# Patient Record
Sex: Female | Born: 1935 | Race: White | Hispanic: No | Smoking: Never smoker
Health system: Southern US, Community
[De-identification: ages and names within clinical notes are randomized; demographics above are authoritative.]

## PROBLEM LIST (undated history)

## (undated) DIAGNOSIS — C50919 Malignant neoplasm of unspecified site of unspecified female breast: Secondary | ICD-10-CM

## (undated) DIAGNOSIS — Z923 Personal history of irradiation: Secondary | ICD-10-CM

## (undated) DIAGNOSIS — E119 Type 2 diabetes mellitus without complications: Secondary | ICD-10-CM

## (undated) DIAGNOSIS — K219 Gastro-esophageal reflux disease without esophagitis: Secondary | ICD-10-CM

## (undated) DIAGNOSIS — E079 Disorder of thyroid, unspecified: Secondary | ICD-10-CM

## (undated) HISTORY — DX: Gastro-esophageal reflux disease without esophagitis: K21.9

## (undated) NOTE — *Deleted (*Deleted)
[facility]  [address], [address] [place], [place] [address] Phone: [phone]  Fax: [phone]  Clinic Day:  04/15/2020  Referring physician: Marina Goodell, MD  Chief Complaint: Danielle Bennett is a 76 y.o. female with a history of multi-focal right breast DCIS (1995) and stage IA Her2/neu + left breast cancer (2015) who is seen for 6 month assessment.  HPI: The patient was last seen in the medical oncology clinic on 09/22/2019 via telemedicine. At that time, she is doing well.  She denies any complaints. Discussed BCI testing and the need for dental clearance for Prolia. She continued calcium and Vitamin D. She was still taking Arimidex but was considering discontinuing it.  The patient saw Dr. Cherylann Ratel on 12/08/2019. Her chronic kidney disease was felt to be stable with a recent GFR of 26. Her lower extremity edema was managed with furosemide 40 mg BID. She reported shortness of breath with minimal exertion. Follow up was planned for 3 months.  During the interim, ***   Past Medical History:  Diagnosis Date  . Breast cancer (HCC) 1995   right breast cancer  . Breast cancer (HCC) 2015   left breast cancer  . Diabetes mellitus without complication (HCC)   . GERD (gastroesophageal reflux disease)   . Personal history of radiation therapy 1995  . Personal history of radiation therapy 2015  . Thyroid disease     Past Surgical History:  Procedure Laterality Date  . BREAST BIOPSY Left 10/01/2017   affirm stereo/ neg  . BREAST LUMPECTOMY Right 1995  . BREAST LUMPECTOMY Left 04/2014    Family History  Problem Relation Age of Onset  . Heart attack Mother   . Kidney cancer Brother   . Prostate cancer Brother   . Esophageal cancer Brother   . Breast cancer Neg Hx     Social History:  reports that she has never smoked. She has never used smokeless tobacco. She reports that she does not drink alcohol and does not use drugs.  She lives 10  miles [employer] of [address]. 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. She lives alone.  She has family members who live on either side of her. The patient is alone*** today.  Allergies:  Allergies  Allergen Reactions  . Simvastatin     Other reaction(s): Other (See Comments) Myalgias    Current Medications: Current Outpatient Medications  Medication Sig Dispense Refill  . anastrozole (ARIMIDEX) 1 MG tablet Take 1 tablet (1 mg total) by mouth daily. 90 tablet 0  . apixaban (ELIQUIS) 5 MG TABS tablet Take 2.5 mg by mouth daily.     Marland Kitchen atorvastatin (LIPITOR) 40 MG tablet TAKE ONE TABLET BY MOUTH ONCE DAILY AT NIGHT TIME - MWF. and also takes a half tablet all other days.    . betamethasone valerate (VALISONE) 0.1 % cream Apply to skin as directed prn for itch    . Calcium Carb-Cholecalciferol (CALTRATE 600+D) 600-800 MG-UNIT TABS Take 1 tablet by mouth 2 (two) times daily.     . famotidine (PEPCID) 10 MG tablet Take 10 mg by mouth at bedtime.     . furosemide (LASIX) 20 MG tablet Take 40 mg by mouth 2 (two) times daily.     Marland Kitchen glipiZIDE (GLUCOTROL XL) 10 MG 24 hr tablet Take 10 mg by mouth daily.     Marland Kitchen glucose blood test strip USE 3 (THREE) TIMES DAILY.    Marland Kitchen insulin lispro (HUMALOG) 100 UNIT/ML KwikPen 10  before breakfast, 12 units before lunch and dinner, plus sliding scale, max 60 units daily    . Insulin Pen Needle (B-D ULTRAFINE III SHORT PEN) 31G X 8 MM MISC USE 1  THREE TIMES DAILY AS DIRECTED    . levothyroxine (SYNTHROID, LEVOTHROID) 137 MCG tablet Take 137 mcg by mouth daily.     . Liraglutide (VICTOZA) 18 MG/3ML SOPN Inject into the skin daily.     Marland Kitchen lisinopril (PRINIVIL,ZESTRIL) 20 MG tablet Take 10 mg by mouth daily.   3  . metoprolol succinate (TOPROL-XL) 100 MG 24 hr tablet Take 100 mg by mouth. Take 1.5 tablets once a day.    . metroNIDAZOLE (METROCREAM) 0.75 % cream Apply 1 application topically daily.     . Microlet Lancets MISC USE TO CHECK GLUCOSE THREE TIMES DAILY AS  DIRECTED    . nystatin (MYCOSTATIN/NYSTOP) powder Apply topically 3 (three) times daily. 30 g 5  . PREVIDENT 5000 DRY MOUTH 1.1 % GEL dental gel BRUSH WITH PASTE 2 TO 3 TIMES PER DAY ** DO NOT RINSE **  5  . TRESIBA FLEXTOUCH 100 UNIT/ML SOPN FlexTouch Pen Inject 35 Units into the skin daily.      No current facility-administered medications for this visit.    Review of Systems  Constitutional: Negative for chills, diaphoresis, fever, malaise/fatigue and weight loss.       Feels "good".  HENT: Negative.  Negative for congestion, ear pain, nosebleeds, sinus pain and sore throat.   Eyes: Negative.  Negative for blurred vision and double vision.  Respiratory: Positive for shortness of breath (with walking). Negative for cough and sputum production.   Cardiovascular: Positive for orthopnea (sleep in a chair at times). Negative for chest pain, palpitations and leg swelling.  Gastrointestinal: Negative for abdominal pain, blood in stool, constipation, diarrhea, heartburn, melena, nausea and vomiting.  Genitourinary: Negative.  Negative for dysuria, frequency, hematuria and urgency.  Musculoskeletal: Negative.  Negative for back pain, joint pain and myalgias.  Skin: Positive for rash (under breasts). Negative for itching.  Neurological: Negative.  Negative for dizziness, tingling, sensory change, speech change, weakness and headaches.       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  Endo/Heme/Allergies: Does not bruise/bleed easily.       Diabetes. Thyroid disease on Synthroid.   Psychiatric/Behavioral: Negative.  Negative for depression and memory loss. The patient is not nervous/anxious and does not have insomnia.   All other systems reviewed and are negative.  Performance status (ECOG):  1***  Wt Readings from Last 3 Encounters:  09/15/19 276 lb (125.2 kg)  12/22/18 274 lb 4.8 oz (124.4 kg)  08/10/18 250 lb (113.4 kg)    Vital Signs There were no vitals taken for this visit.   Physical Exam  No visits  with results within 3 Day(s) from this visit.  Latest known visit with results is:  Appointment on 09/15/2019  Component Date Value Ref Range Status  . CA 27.29 09/15/2019 16.5  0.0 - 38.6 U/mL Final   Comment: (NOTE) Siemens Centaur Immunochemiluminometric Methodology [facility]) Values obtained with different assay methods or kits cannot be used interchangeably. Results cannot be interpreted as absolute evidence of the presence or absence of malignant disease. Performed At: [facility] [address] [place], [place] [id number] Jolene Schimke MD WU:[id number]   . Sodium 09/15/2019 137  135 - 145 mmol/L Final  . Potassium 09/15/2019 5.0  3.5 - 5.1 mmol/L Final  . Chloride 09/15/2019 104  98 - 111 mmol/L Final  .  CO2 09/15/2019 21* 22 - 32 mmol/L Final  . Glucose, Bld 09/15/2019 224* 70 - 99 mg/dL Final   Glucose reference range applies only to samples taken after fasting for at least 8 hours.  . BUN 09/15/2019 41* 8 - 23 mg/dL Final  . Creatinine, Ser 09/15/2019 1.86* 0.44 - 1.00 mg/dL Final  . Calcium 09/15/2019 9.0  8.9 - 10.3 mg/dL Final  . Total Protein 09/15/2019 6.8  6.5 - 8.1 g/dL Final  . Albumin 09/15/2019 3.3* 3.5 - 5.0 g/dL Final  . AST 09/15/2019 20  15 - 41 U/L Final  . ALT 09/15/2019 19  0 - 44 U/L Final  . Alkaline Phosphatase 09/15/2019 61  38 - 126 U/L Final  . Total Bilirubin 09/15/2019 0.8  0.3 - 1.2 mg/dL Final  . GFR calc non Af Amer 09/15/2019 25* >60 mL/min Final  . GFR calc Af Amer 09/15/2019 29* >60 mL/min Final  . Anion gap 09/15/2019 12  5 - 15 Final   Performed at [facility], [address], [place], [place] [address]    Assessment:  Haislee Corso is a 30 y.o. female with multi-focal right breast DCIS (1995) and stage IA Her2/neu + left breast cancer (2015).  She was diagnosed with multifocal DCIS of the right breast in 1995.  She underwent biopsy on 02/01/1994.  Pathology revealed multi-focal DCIS with comedo necrosis.   There was no evidence of lymphatic/vascular invasion.  Microcalcifications were present.  Margins were close (less than 1 mm).  She underwent re-excision on 03/01/1994 by Dr. Siri Cole at [facility].  There was no gross evidence of malignancy.  She received radiation.  She did not receive hormonal therapy.  She was diagnosed with Her2/neu + left breast cancer in 2015.  Stereotactic core biopsy on 04/05/2014 revealed invasive mammary carcinoma of no special type (1.1 mm) in a background of DCIS.  Wide excision of the mass in the left upper outer quadrant on 04/26/2014 revealed grade II DCIS, cribriform type with focal lobular cytology.  There was no residual invasive carcinoma.  No tumor was seen in 2 lymph nodes.  Tumor was ER positive (> 90%), PR positive (90%), and Her2/neu 3+.  Pathologic stage was T1aN0(sn) M0.  She received adjuvant left whole breast radiation in 06/2014.  She started Arimidex in 07/2014.  Bilateral mammogram on 08/22/2016 revealed no evidence of malignancy.  Bilateral diagnostic mammogram on 09/18/2017 revealed a 1.2 cm group of indeterminate calcifications in the left breast adjacent to the lumpectomy site.  Left breast biopsy on 10/01/2017 revealed cytologic changes c/w prior radiation therapy.  There was scar-like fibrosis and foreign body giant cell reaction.  Microcalcifications were present.  There was no atypia or malignancy.  Bilateral diagnostic mammogram on 04/19/2019 revealed no evidence of malignancy.  CA27.29 has been followed: 19.2 on 02/07/2016, 16.4 on 06/05/2016, 18.1 on 10/09/2016, 16.2 on 04/09/2017, 17.4 on 10/15/2017, 15.9 on 04/22/2018, 20.4 on 12/22/2018, and 16.5 on 09/15/2019.  Bone density on 06/27/2015 revealed osteoporosis with a T score of -3.2 in the left femoral neck, -2.5 in the hip and -2.4 in the left forearm.  She was previously on Fosamax, but discontinued for an unspecified period of time.  Bone density on 06/26/2017 revealed osteoporosis with  a T-score of -3.7 in the left femoral neck.  Bone density on 09/20/2019 revealed osteoporosis with a T-score of -3.5 at the left femoral neck.  She restarted Fosamax in 06/2015 (discontinued 2018).  She is on calcium and  vitamin D.  She was admitted to [facility] from 11/12/2017 - 11/28/2017.  She developed acute hypoxic hypercarbic respiratory failure requiring intubation secondary to CAP. She was admitted to [facility] MICU and transferred to [place] [facility] on 11/14/17 following extubation for continued treatment of her CAP vs. aspiration pneumonia. Following resolution of PNA, she was found to have temporarily depressed LVEF (15-20%).  She required significant diuresis.   Repeat echo 11/26/2017 showed LVEF >70%, suggesting temporarily depressed EF due to tachyarrhythmia-induced cardiomyopathy secondary to hypervolemia and uncontrolled afib in setting of acute respiratory illness.  Symptomatically, ***  Plan: 1.   Labs today: CBC with diff, CMP, CA27.29   2.   Stage IA Her2/neu + left breast cancer             Clinically, she is doing well.             Bilateral diagnostic mammogram on 04/19/2019 revealed no evidence of malignancy.   She has been on Arimidex since 07/2014.  Review high risk Her2/neu + breast cancer and consideration of extended adjuvant endocrine therapy.  Patient was considering discontinuing Arimidex.  Discuss extended adjuvant therapy.  Discuss BCI testing.  Suspect patient will have a high risk of recurrence. 3.   Osteoporosis             Bone density on 09/20/2019 revealed osteoporosis with a T-score of -3.5 at the left femoral neck.    She restarted Fosamax in 06/2015 (discontinued 2018 secondary to diarrhea).    She is on calcium and vitamin D.  Discuss consideration of Fosamax or Prolia.  Discuss need for dental clearance. 4.   Candidal rash  Patient has moist areas under breasts and history of recurrent Candidal infections.  Continue Nystatin prn. 5.   Screening mammogram  04/19/2020. 6.   Please send copy of bone density to patient. 7.   RN to send patient booklet with phone number to contact [facility] regarding cost. 8.   RN to call patient about whether she wishes to pursue BCI testing. 9.   RN to call patient regarding decision re: Prolia and dental clearance. 10.   RTC in 6 months for MD assess, labs (CBC with diff, CMP, CA27.29), and review of testing.  I discussed the assessment and treatment plan with the patient.  The patient was provided an opportunity to ask questions and all were answered.  The patient agreed with the plan and demonstrated an understanding of the instructions.  The patient was advised to call back if the symptoms worsen or if the condition fails to improve as anticipated.  I provided *** minutes of face-to-face time during this this encounter and > 50% was spent counseling as documented under my assessment and plan.  Rosey Bath, MD, PhD    04/15/2020, 1:09 PM  I, Theador Hawthorne, am acting as scribe for [employer]. Merlene Pulling, MD, PhD.  {Add scribe attestation statement}

---
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
## 1993-06-24 DIAGNOSIS — Z923 Personal history of irradiation: Secondary | ICD-10-CM

## 1993-06-24 DIAGNOSIS — C50919 Malignant neoplasm of unspecified site of unspecified female breast: Secondary | ICD-10-CM

## 1993-06-24 HISTORY — DX: Personal history of irradiation: Z92.3

## 1993-06-24 HISTORY — PX: BREAST LUMPECTOMY: SHX2

## 1993-06-24 HISTORY — DX: Malignant neoplasm of unspecified site of unspecified female breast: C50.919

## 1994-04-05 DIAGNOSIS — D0510 Intraductal carcinoma in situ of unspecified breast: Secondary | ICD-10-CM | POA: Insufficient documentation

## 2005-11-12 ENCOUNTER — Ambulatory Visit: Payer: Self-pay | Admitting: Gastroenterology

## 2006-07-22 ENCOUNTER — Ambulatory Visit: Payer: Self-pay | Admitting: Family Medicine

## 2006-07-25 ENCOUNTER — Ambulatory Visit: Payer: Self-pay | Admitting: Family Medicine

## 2010-11-27 ENCOUNTER — Ambulatory Visit: Payer: Self-pay | Admitting: Family Medicine

## 2011-01-08 ENCOUNTER — Ambulatory Visit: Payer: Self-pay | Admitting: Internal Medicine

## 2011-01-23 ENCOUNTER — Ambulatory Visit: Payer: Self-pay | Admitting: Internal Medicine

## 2011-03-19 ENCOUNTER — Emergency Department: Payer: Self-pay | Admitting: Emergency Medicine

## 2012-01-14 ENCOUNTER — Ambulatory Visit: Payer: Self-pay | Admitting: Family Medicine

## 2012-04-30 ENCOUNTER — Ambulatory Visit: Payer: Self-pay | Admitting: Gastroenterology

## 2013-02-09 ENCOUNTER — Ambulatory Visit: Payer: Self-pay | Admitting: Family Medicine

## 2013-02-11 ENCOUNTER — Ambulatory Visit: Payer: Self-pay | Admitting: Family Medicine

## 2013-08-16 ENCOUNTER — Ambulatory Visit: Payer: Self-pay | Admitting: Family Medicine

## 2013-11-26 ENCOUNTER — Ambulatory Visit: Payer: Self-pay | Admitting: Physician Assistant

## 2013-12-10 ENCOUNTER — Ambulatory Visit: Payer: Self-pay | Admitting: Physician Assistant

## 2014-02-04 ENCOUNTER — Ambulatory Visit: Payer: Self-pay | Admitting: Family Medicine

## 2014-02-04 LAB — CBC WITH DIFFERENTIAL/PLATELET
Basophil #: 0.1 10*3/uL (ref 0.0–0.1)
Basophil %: 0.8 %
Eosinophil #: 0 10*3/uL (ref 0.0–0.7)
Eosinophil %: 0.3 %
HCT: 41.6 % (ref 35.0–47.0)
HGB: 13.7 g/dL (ref 12.0–16.0)
LYMPHS ABS: 1.6 10*3/uL (ref 1.0–3.6)
Lymphocyte %: 15.3 %
MCH: 30.2 pg (ref 26.0–34.0)
MCHC: 33 g/dL (ref 32.0–36.0)
MCV: 91 fL (ref 80–100)
MONO ABS: 0.6 x10 3/mm (ref 0.2–0.9)
MONOS PCT: 5.8 %
NEUTROS PCT: 77.8 %
Neutrophil #: 8.1 10*3/uL — ABNORMAL HIGH (ref 1.4–6.5)
PLATELETS: 310 10*3/uL (ref 150–440)
RBC: 4.56 10*6/uL (ref 3.80–5.20)
RDW: 14.7 % — ABNORMAL HIGH (ref 11.5–14.5)
WBC: 10.4 10*3/uL (ref 3.6–11.0)

## 2014-02-04 LAB — URINALYSIS, COMPLETE
BLOOD: NEGATIVE
Bilirubin,UR: NEGATIVE
GLUCOSE, UR: NEGATIVE
Ketone: NEGATIVE
NITRITE: NEGATIVE
Ph: 6 (ref 5.0–8.0)
Protein: NEGATIVE
Specific Gravity: 1.015 (ref 1.000–1.030)

## 2014-02-04 LAB — BASIC METABOLIC PANEL
Anion Gap: 11 (ref 7–16)
BUN: 16 mg/dL (ref 7–18)
CALCIUM: 9.7 mg/dL (ref 8.5–10.1)
CHLORIDE: 98 mmol/L (ref 98–107)
Co2: 24 mmol/L (ref 21–32)
Creatinine: 0.95 mg/dL (ref 0.60–1.30)
EGFR (Non-African Amer.): 58 — ABNORMAL LOW
Glucose: 206 mg/dL — ABNORMAL HIGH (ref 65–99)
Osmolality: 274 (ref 275–301)
Potassium: 4.3 mmol/L (ref 3.5–5.1)
Sodium: 133 mmol/L — ABNORMAL LOW (ref 136–145)

## 2014-02-06 LAB — URINE CULTURE

## 2014-03-18 ENCOUNTER — Ambulatory Visit: Payer: Self-pay | Admitting: Family Medicine

## 2014-03-24 ENCOUNTER — Ambulatory Visit: Payer: Self-pay | Admitting: Family Medicine

## 2014-04-05 ENCOUNTER — Ambulatory Visit: Payer: Self-pay | Admitting: Family Medicine

## 2014-04-21 ENCOUNTER — Ambulatory Visit: Payer: Self-pay | Admitting: Surgery

## 2014-04-21 DIAGNOSIS — I10 Essential (primary) hypertension: Secondary | ICD-10-CM

## 2014-04-21 LAB — BASIC METABOLIC PANEL
ANION GAP: 12 (ref 7–16)
BUN: 12 mg/dL (ref 7–18)
CREATININE: 0.86 mg/dL (ref 0.60–1.30)
Calcium, Total: 9.2 mg/dL (ref 8.5–10.1)
Chloride: 104 mmol/L (ref 98–107)
Co2: 26 mmol/L (ref 21–32)
EGFR (African American): >60
Glucose: 137 mg/dL — ABNORMAL HIGH (ref 65–99)
OSMOLALITY: 285 (ref 275–301)
POTASSIUM: 4.1 mmol/L (ref 3.5–5.1)
Sodium: 142 mmol/L (ref 136–145)

## 2014-04-21 LAB — CBC WITH DIFFERENTIAL/PLATELET
BASOS PCT: 0.7 %
Basophil #: 0.1 10*3/uL (ref 0.0–0.1)
EOS ABS: 0.1 10*3/uL (ref 0.0–0.7)
Eosinophil %: 1.1 %
HCT: 40.9 % (ref 35.0–47.0)
HGB: 13.6 g/dL (ref 12.0–16.0)
LYMPHS PCT: 15.5 %
Lymphocyte #: 1.5 10*3/uL (ref 1.0–3.6)
MCH: 30.7 pg (ref 26.0–34.0)
MCHC: 33.1 g/dL (ref 32.0–36.0)
MCV: 93 fL (ref 80–100)
Monocyte #: 0.5 x10 3/mm (ref 0.2–0.9)
Monocyte %: 5.2 %
Neutrophil #: 7.5 10*3/uL — ABNORMAL HIGH (ref 1.4–6.5)
Neutrophil %: 77.5 %
Platelet: 277 10*3/uL (ref 150–440)
RBC: 4.42 10*6/uL (ref 3.80–5.20)
RDW: 14.7 % — AB (ref 11.5–14.5)
WBC: 9.7 10*3/uL (ref 3.6–11.0)

## 2014-04-24 HISTORY — PX: BREAST LUMPECTOMY: SHX2

## 2014-04-26 ENCOUNTER — Ambulatory Visit: Payer: Self-pay | Admitting: Surgery

## 2014-04-26 DIAGNOSIS — C50412 Malignant neoplasm of upper-outer quadrant of left female breast: Secondary | ICD-10-CM | POA: Insufficient documentation

## 2014-05-13 LAB — PATHOLOGY REPORT

## 2014-05-27 ENCOUNTER — Ambulatory Visit: Payer: Self-pay | Admitting: Radiation Oncology

## 2014-06-20 LAB — CBC CANCER CENTER
BASOS ABS: 0 x10 3/mm (ref 0.0–0.1)
Basophil %: 0.5 %
EOS PCT: 0.7 %
Eosinophil #: 0.1 x10 3/mm (ref 0.0–0.7)
HCT: 39.5 % (ref 35.0–47.0)
HGB: 13 g/dL (ref 12.0–16.0)
LYMPHS ABS: 1.8 x10 3/mm (ref 1.0–3.6)
Lymphocyte %: 19.3 %
MCH: 30.3 pg (ref 26.0–34.0)
MCHC: 32.9 g/dL (ref 32.0–36.0)
MCV: 92 fL (ref 80–100)
MONO ABS: 0.6 x10 3/mm (ref 0.2–0.9)
MONOS PCT: 6.5 %
Neutrophil #: 7 x10 3/mm — ABNORMAL HIGH (ref 1.4–6.5)
Neutrophil %: 73 %
PLATELETS: 238 x10 3/mm (ref 150–440)
RBC: 4.3 10*6/uL (ref 3.80–5.20)
RDW: 15.1 % — AB (ref 11.5–14.5)
WBC: 9.5 x10 3/mm (ref 3.6–11.0)

## 2014-06-24 ENCOUNTER — Ambulatory Visit: Payer: Self-pay | Admitting: Radiation Oncology

## 2014-06-27 LAB — CBC CANCER CENTER
BASOS ABS: 0.1 x10 3/mm (ref 0.0–0.1)
Basophil %: 0.8 %
Eosinophil #: 0.1 x10 3/mm (ref 0.0–0.7)
Eosinophil %: 1.3 %
HCT: 41.3 % (ref 35.0–47.0)
HGB: 13.6 g/dL (ref 12.0–16.0)
Lymphocyte #: 1.6 x10 3/mm (ref 1.0–3.6)
Lymphocyte %: 18.6 %
MCH: 30.6 pg (ref 26.0–34.0)
MCHC: 33 g/dL (ref 32.0–36.0)
MCV: 93 fL (ref 80–100)
MONO ABS: 0.6 x10 3/mm (ref 0.2–0.9)
Monocyte %: 7.5 %
NEUTROS PCT: 71.8 %
Neutrophil #: 6 x10 3/mm (ref 1.4–6.5)
PLATELETS: 237 x10 3/mm (ref 150–440)
RBC: 4.45 10*6/uL (ref 3.80–5.20)
RDW: 15 % — ABNORMAL HIGH (ref 11.5–14.5)
WBC: 8.3 x10 3/mm (ref 3.6–11.0)

## 2014-07-04 LAB — CBC CANCER CENTER
BASOS ABS: 0.1 x10 3/mm (ref 0.0–0.1)
BASOS PCT: 0.7 %
EOS ABS: 0.1 x10 3/mm (ref 0.0–0.7)
Eosinophil %: 1 %
HCT: 39.4 % (ref 35.0–47.0)
HGB: 13.1 g/dL (ref 12.0–16.0)
LYMPHS PCT: 14.7 %
Lymphocyte #: 1.3 x10 3/mm (ref 1.0–3.6)
MCH: 30.8 pg (ref 26.0–34.0)
MCHC: 33.4 g/dL (ref 32.0–36.0)
MCV: 92 fL (ref 80–100)
Monocyte #: 0.5 x10 3/mm (ref 0.2–0.9)
Monocyte %: 5.9 %
NEUTROS ABS: 6.7 x10 3/mm — AB (ref 1.4–6.5)
Neutrophil %: 77.7 %
Platelet: 243 x10 3/mm (ref 150–440)
RBC: 4.26 10*6/uL (ref 3.80–5.20)
RDW: 15 % — ABNORMAL HIGH (ref 11.5–14.5)
WBC: 8.6 x10 3/mm (ref 3.6–11.0)

## 2014-07-11 LAB — CBC CANCER CENTER
BASOS ABS: 0 x10 3/mm (ref 0.0–0.1)
BASOS PCT: 0.5 %
EOS ABS: 0.1 x10 3/mm (ref 0.0–0.7)
Eosinophil %: 1.3 %
HCT: 39.6 % (ref 35.0–47.0)
HGB: 12.9 g/dL (ref 12.0–16.0)
Lymphocyte #: 0.9 x10 3/mm — ABNORMAL LOW (ref 1.0–3.6)
Lymphocyte %: 12.2 %
MCH: 31.1 pg (ref 26.0–34.0)
MCHC: 32.7 g/dL (ref 32.0–36.0)
MCV: 95 fL (ref 80–100)
MONO ABS: 0.4 x10 3/mm (ref 0.2–0.9)
Monocyte %: 5.9 %
NEUTROS PCT: 80.1 %
Neutrophil #: 6.1 x10 3/mm (ref 1.4–6.5)
PLATELETS: 244 x10 3/mm (ref 150–440)
RBC: 4.15 10*6/uL (ref 3.80–5.20)
RDW: 15 % — AB (ref 11.5–14.5)
WBC: 7.6 x10 3/mm (ref 3.6–11.0)

## 2014-07-18 LAB — CBC CANCER CENTER
BASOS PCT: 0.8 %
Basophil #: 0.1 x10 3/mm (ref 0.0–0.1)
EOS PCT: 1.1 %
Eosinophil #: 0.1 x10 3/mm (ref 0.0–0.7)
HCT: 38.2 % (ref 35.0–47.0)
HGB: 12.8 g/dL (ref 12.0–16.0)
LYMPHS PCT: 14.7 %
Lymphocyte #: 1.1 x10 3/mm (ref 1.0–3.6)
MCH: 31.1 pg (ref 26.0–34.0)
MCHC: 33.4 g/dL (ref 32.0–36.0)
MCV: 93 fL (ref 80–100)
MONO ABS: 0.5 x10 3/mm (ref 0.2–0.9)
Monocyte %: 6.1 %
Neutrophil #: 5.8 x10 3/mm (ref 1.4–6.5)
Neutrophil %: 77.3 %
PLATELETS: 242 x10 3/mm (ref 150–440)
RBC: 4.1 10*6/uL (ref 3.80–5.20)
RDW: 15.2 % — ABNORMAL HIGH (ref 11.5–14.5)
WBC: 7.5 x10 3/mm (ref 3.6–11.0)

## 2014-07-25 ENCOUNTER — Ambulatory Visit: Payer: Self-pay | Admitting: Radiation Oncology

## 2014-08-23 ENCOUNTER — Ambulatory Visit
Admit: 2014-08-23 | Disposition: A | Discharge status: Home / Self Care | Payer: Self-pay | Attending: Internal Medicine | Admitting: Internal Medicine

## 2014-10-15 NOTE — Op Note (Signed)
PATIENT NAME:  Danielle Bennett, MOSTELLER MR#:  [id number] DATE OF BIRTH:  1935-04-22  DATE OF PROCEDURE:  04/26/2014  PREOPERATIVE DIAGNOSIS: Left breast carcinoma.  POSTOPERATIVE DIAGNOSIS: Left breast carcinoma.  OPERATION: Left breast partial mastectomy and axillary lymph node biopsy.  SURGEON: Rodena Goldmann III, MD   ANESTHESIA: General.  OPERATIVE PROCEDURE: With the patient in the supine position, after placement of appropriate general anesthesia, the patient's left breast was prepped with chloraprep and draped with sterile towels. An elliptical incision was made around the area identified by the wire and carried down through the subcutaneous tissue with Bovie electrocautery. The mass was dissected off the chest wall without difficulty. It was marked with margin markers and sent for specimen mammography which did reveal the presence of the lesion. The area was irrigated and temporarily closed with clips. The axilla was interrogated with neoprobe apparatus and no significant area of radioactive concentration could be identified. This woman is quite obese, it was very difficult to identify any lymph nodes. The axilla was then opened. A transverse incision was carried down through the subcutaneous tissue with Bovie electrocautery. The axilla was then entered. Two lymph nodes were identified, sent for permanent sections after proving they did not have any radioactive material. I could not identify any radioactive material in the axilla. A JP drain was inserted through a separate stab wound through the axilla and into the breast incision. All incisions were closed with vertical mattress sutures of 4-0 nylon. The drain was secured with 3-0 nylon. Sterile dressings were applied. The patient returned to the recovery room having tolerated the procedure well. Sponge, instrument and needle counts were correct x 2 in the operating room.  ____________________________ Rodena Goldmann III, MD rle:TT D: 04/26/2014  13:10:57 ET T: 04/26/2014 22:25:58 ET JOB#: [id number]  cc: Rodena Goldmann III, MD, <Dictator> Rodena Goldmann MD ELECTRONICALLY SIGNED 05/04/2014 13:45

## 2014-10-15 NOTE — Consult Note (Signed)
Reason for Visit: This 80 year old Female patient presents to the clinic for initial evaluation of  breast cancer .   Referred by Dr. Pat Patrick.  Diagnosis:  Chief Complaint/Diagnosis   80 year old female status post wide local excision and sentinel node biopsy for stage I (T1 1 N0M0) invasive mammary carcinoma ER/PR positive HER-2/neu overexpressed for adjuvant whole breast radiation and aromatase inhibitor.  Pathology Report pathology report reviewed   Imaging Report mammograms reviewed   Referral Report clinical notes reviewed   Planned Treatment Regimen adjuvant whole breast radiation plus aromatase inhibitor   HPI   patient is a 80 year old female history of 20 years prior having right-sided breast cancer status post radiation therapy. Recently presented with new microcalcifications in the left breast in the upper outer quadrant. She underwent mammographic guided biopsy showing DCIS overall grade 2. She did have a 1.1 mm focus of invasive carcinoma ER/PR positive HER-2/neu +3. 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 2 sentinel lymph nodes were negative. Again invasive component was ER/PR positive and HER-2/neu overexpressed. She is seen today for consideration of radiation therapy she is doing well. Patient is morbidly obeseand has other medical comorbidities including diabetes hypothyroidism hypertension. She overall is doing is doing well seems to be recovering nicely from her partial mastectomy.  Past Hx:    Diabetes:    Hypercholesterolemia:    GERD - Esophageal Reflux:    HTN:   Past, Family and Social History:  Past Medical History positive   Cardiovascular hyperlipidemia; hypertension   Gastrointestinal GERD; hiatal hernia   Endocrine diabetes mellitus; hypothyroidism   Past Medical History Comments osteopenia, rosacea, and morbid obesity   Family History positive   Family  History Comments family history positive for mother with brain tumor brother with cardiac vascular heart disease and father with both asthma and Parkinson's   Social History noncontributory   Additional Past Medical and Surgical History accompanied by her brother today   Allergies:   Prednisone: Rash  Home Meds:  Home Medications: Medication Instructions Status  Actos 30 mg oral tablet 1 tab(s) orally once a day (at bedtime) Active  Caltrate 600 + D 600 mg-800 intl units oral tablet 1 tab(s) orally 2 times a day Active  furosemide 20 mg oral tablet 1 tab(s) orally once a day (in the morning), As Needed Active  glipiZIDE 10 mg oral tablet 1 tab(s) orally 2 times a day Active  Lipitor 40 mg oral tablet 1 tab(s) orally every other day Active  Lipitor 40 mg oral tablet 0.5 tab(s) orally every other day Active  lisinopril 20 mg oral tablet 1 tab(s) orally once a day (in the morning) Active  Synthroid 100 mcg (0.1 mg) oral tablet 1 tab(s) orally once a day (in the morning) Active  Victoza 18 mg/3 mL subcutaneous solution 1.2 milligram(s) subcutaneous once a day (at bedtime) Active  metformin 1000 mg oral tablet 1 tab(s) orally 2 times a day Active  ranitidine 150 mg oral tablet 1 tab(s) orally 2 times a day, As Needed Active   Review of Systems:  Performance Status (ECOG) 0   Skin negative   Breast see HPI   Ophthalmologic negative   ENMT negative   Respiratory and Thorax negative   Cardiovascular negative   Gastrointestinal negative   Genitourinary negative   Musculoskeletal negative   Neurological negative   Psychiatric negative   Hematology/Lymphatics  negative   Endocrine see HPI   Allergic/Immunologic negative   Nursing Notes:  Nursing Vital Signs and Chemo Nursing Nursing Notes: *CC Vital Signs Flowsheet:   04-Dec-15 09:59  Temp Temperature 97.8  Pulse Pulse 89  Respirations Respirations 20  SBP SBP 187  DBP DBP 77  Pain Scale (0-10)  0  Current Weight  (kg) (kg) 127.6   Physical Exam:  General/Skin/HEENT:  Skin normal   Eyes normal   ENMT normal   Head and Neck normal   Additional PE morbidly obese female in NAD wheelchair-bound. Right breast has an area of thickening in the central portion status post prior lumpectomy and radiation therapy. Left breast is recent wide local excision scar which is healing well as well as an axillary scar also will healing well. No dominant mass or nodularity is noted in the left breast in 2 positions examined. No axillary or supraclavicular adenopathy is noted. Lungs are clear to A&P cardiac examination shows regular rate and rhythm.   Breasts/Resp/CV/GI/GU:  Respiratory and Thorax normal   Cardiovascular normal   Gastrointestinal normal   Genitourinary normal   MS/Neuro/Psych/Lymph:  Musculoskeletal normal   Neurological normal   Lymphatics normal   Other Results:  Radiology Results: [place]:    01-Oct-15 09:33, Digital Additional Views Lt Breast (SCR)  Digital Additional Views Lt Breast (SCR)   REASON FOR EXAM:    LT BREAST AV FOR ASYMMETRY AND CALCS  COMMENTS:       PROCEDURE: MAM - MAM DGTL ADD VW LT  SCR  - Mar 24 2014  9:33AM     CLINICAL DATA:  Screening callback for questioned left medial and  lateral calcifications. Right lumpectomy for breast cancer 1995.    EXAM:  DIGITAL DIAGNOSTIC  left MAMMOGRAM    COMPARISON:  [facility] dating back to 09/25/2007    ACR Breast Density Category b: There are scattered areas of  fibroglandular density.  FINDINGS:  In the left lower inner quadrant,there are round calcifications and  1 adjacent rod-like calcification, not significantly changed from  prior exams dating back to at least 2011 allowing for differences in  technique. A questioned focal asymmetry is not reproduced on  additional imaging.    In the left upper outer quadrant there is a 1 cm cluster of coarse  heterogeneous calcifications, corresponding to the  screening  mammographic finding. These are new since prior exams.     IMPRESSION:  Suspicious left upper outer quadrant 1 cmcluster of calcifications,  for which stereotactic core needle biopsy is recommended.  No persistent abnormality in the left medial breast.    RECOMMENDATION:  Left stereotactic core needle biopsy    I have discussed the findings and recommendations with the patient.  Results were also provided in writing at the conclusion of the  visit. If applicable, a reminder letter will be sent to the patient  regarding the next appointment.    BI-RADS CATEGORY  4: Suspicious.      Electronically Signed    By: Conchita Paris M.D.    On: 03/24/2014 10:12         Verified By: Arline Asp, M.D.,   Relevent Results:   Relevant Scans and Labs mammograms are reviewed   Assessment and Plan: Impression:   stage I invasive mammary carcinoma of the left breast status post wide local excision and sentinel node biopsy in 80 year old morbidly obese female with a 1.1 mm focus of invasive carcinoma over a large area of  ductal carcinoma in situ. Plan:   I discussed the case with medical oncology. Based on the extremely small tumor burden even though this is a HER-2/neu overexpressed lesion would not recommend systemic chemotherapy. I will have her formally consult by medical oncology in the future for aromatase inhibitor recommendation.I have recommended whole breast radiation therapy and a hypofractionated course over 4 weeks. Also would treat her scar another 1600 cGy based on the close margin. Risks and benefits of treatment including skin reaction, inclusion of some superficial lung, fatigue,all were discussed in detail with the patient and her brother. They both seem to comprehend my treatment plan well. I have set up and ordered a CT simulation for next week.  I would like to take this opportunity for allowing me to participate in the care of your patient..  Fax to  Physician:  Physicians To Recieve Fax: Trudie Reed - [phone].  Electronic Signatures: Bobbye Reinitz, Roda Shutters (MD)  (Signed 04-Dec-15 11:05)  Authored: HPI, Diagnosis, Past Hx, PFSH, Allergies, Home Meds, ROS, Nursing Notes, Physical Exam, Other Results, Relevent Results, Encounter Assessment and Plan, Fax to Physician   Last Updated: 04-Dec-15 11:05 by Armstead Peaks (MD)

## 2014-10-17 LAB — SURGICAL PATHOLOGY

## 2015-02-15 ENCOUNTER — Encounter: Payer: Self-pay | Admitting: Radiation Oncology

## 2015-02-15 ENCOUNTER — Inpatient Hospital Stay (HOSPITAL_BASED_OUTPATIENT_CLINIC_OR_DEPARTMENT_OTHER): Payer: Medicare Other | Admitting: Internal Medicine

## 2015-02-15 ENCOUNTER — Other Ambulatory Visit: Payer: Self-pay | Admitting: *Deleted

## 2015-02-15 ENCOUNTER — Ambulatory Visit
Admission: RE | Admit: 2015-02-15 | Discharge: 2015-02-15 | Disposition: A | Discharge status: Home / Self Care | Payer: Medicare Other | Source: Ambulatory Visit | Attending: Radiation Oncology | Admitting: Radiation Oncology

## 2015-02-15 ENCOUNTER — Inpatient Hospital Stay: Payer: Medicare Other | Attending: Internal Medicine

## 2015-02-15 VITALS — BP 145/78 | HR 79 | Temp 98.8°F | Resp 20 | Ht 64.0 in | Wt 284.2 lb

## 2015-02-15 DIAGNOSIS — Z170 Estrogen receptor positive status [ER+]: Secondary | ICD-10-CM

## 2015-02-15 DIAGNOSIS — M8580 Other specified disorders of bone density and structure, unspecified site: Secondary | ICD-10-CM | POA: Diagnosis not present

## 2015-02-15 DIAGNOSIS — E039 Hypothyroidism, unspecified: Secondary | ICD-10-CM | POA: Diagnosis not present

## 2015-02-15 DIAGNOSIS — C50412 Malignant neoplasm of upper-outer quadrant of left female breast: Secondary | ICD-10-CM

## 2015-02-15 DIAGNOSIS — Z794 Long term (current) use of insulin: Secondary | ICD-10-CM | POA: Diagnosis not present

## 2015-02-15 DIAGNOSIS — K219 Gastro-esophageal reflux disease without esophagitis: Secondary | ICD-10-CM | POA: Diagnosis not present

## 2015-02-15 DIAGNOSIS — Z809 Family history of malignant neoplasm, unspecified: Secondary | ICD-10-CM | POA: Diagnosis not present

## 2015-02-15 DIAGNOSIS — E780 Pure hypercholesterolemia: Secondary | ICD-10-CM

## 2015-02-15 DIAGNOSIS — K449 Diaphragmatic hernia without obstruction or gangrene: Secondary | ICD-10-CM | POA: Insufficient documentation

## 2015-02-15 DIAGNOSIS — I10 Essential (primary) hypertension: Secondary | ICD-10-CM | POA: Diagnosis not present

## 2015-02-15 DIAGNOSIS — M1289 Other specific arthropathies, not elsewhere classified, multiple sites: Secondary | ICD-10-CM

## 2015-02-15 DIAGNOSIS — R5383 Other fatigue: Secondary | ICD-10-CM | POA: Insufficient documentation

## 2015-02-15 DIAGNOSIS — R0602 Shortness of breath: Secondary | ICD-10-CM

## 2015-02-15 DIAGNOSIS — E1149 Type 2 diabetes mellitus with other diabetic neurological complication: Secondary | ICD-10-CM | POA: Diagnosis not present

## 2015-02-15 DIAGNOSIS — E669 Obesity, unspecified: Secondary | ICD-10-CM | POA: Insufficient documentation

## 2015-02-15 DIAGNOSIS — Z79811 Long term (current) use of aromatase inhibitors: Secondary | ICD-10-CM | POA: Insufficient documentation

## 2015-02-15 DIAGNOSIS — C50912 Malignant neoplasm of unspecified site of left female breast: Secondary | ICD-10-CM

## 2015-02-15 DIAGNOSIS — M129 Arthropathy, unspecified: Secondary | ICD-10-CM | POA: Diagnosis not present

## 2015-02-15 DIAGNOSIS — Z79899 Other long term (current) drug therapy: Secondary | ICD-10-CM | POA: Diagnosis not present

## 2015-02-15 DIAGNOSIS — L719 Rosacea, unspecified: Secondary | ICD-10-CM | POA: Insufficient documentation

## 2015-02-15 HISTORY — DX: Disorder of thyroid, unspecified: E07.9

## 2015-02-15 HISTORY — DX: Type 2 diabetes mellitus without complications: E11.9

## 2015-02-15 HISTORY — DX: Malignant neoplasm of unspecified site of unspecified female breast: C50.919

## 2015-02-15 LAB — CBC WITH DIFFERENTIAL/PLATELET
BASOS PCT: 1 %
Basophils Absolute: 0 10*3/uL (ref 0–0.1)
EOS ABS: 0.1 10*3/uL (ref 0–0.7)
Eosinophils Relative: 1 %
HCT: 37.3 % (ref 35.0–47.0)
HEMOGLOBIN: 12.5 g/dL (ref 12.0–16.0)
Lymphocytes Relative: 15 %
Lymphs Abs: 1.1 10*3/uL (ref 1.0–3.6)
MCH: 30.4 pg (ref 26.0–34.0)
MCHC: 33.4 g/dL (ref 32.0–36.0)
MCV: 91 fL (ref 80.0–100.0)
MONO ABS: 0.5 10*3/uL (ref 0.2–0.9)
MONOS PCT: 6 %
Neutro Abs: 5.8 10*3/uL (ref 1.4–6.5)
Neutrophils Relative %: 77 %
Platelets: 282 10*3/uL (ref 150–440)
RBC: 4.1 MIL/uL (ref 3.80–5.20)
RDW: 14.8 % — AB (ref 11.5–14.5)
WBC: 7.5 10*3/uL (ref 3.6–11.0)

## 2015-02-15 LAB — HEPATIC FUNCTION PANEL
ALT: 17 U/L (ref 14–54)
AST: 20 U/L (ref 15–41)
Albumin: 3.2 g/dL — ABNORMAL LOW (ref 3.5–5.0)
Alkaline Phosphatase: 68 U/L (ref 38–126)
BILIRUBIN TOTAL: 0.4 mg/dL (ref 0.3–1.2)
Total Protein: 6.4 g/dL — ABNORMAL LOW (ref 6.5–8.1)

## 2015-02-15 LAB — CREATININE, SERUM
CREATININE: 0.77 mg/dL (ref 0.44–1.00)
GFR calc Af Amer: >60 mL/min (ref >60)

## 2015-02-15 MED ORDER — ANASTROZOLE 1 MG PO TABS: 1.0000 mg | ORAL_TABLET | Freq: Every day | ORAL | Status: DC

## 2015-02-15 NOTE — Progress Notes (Signed)
Radiation Oncology Follow up Note  Name: Danielle Bennett   Date:   02/15/2015 MRN:  [id number] DOB: [birth year]    This 80 y.o. female presents to the clinic today for follow-up for breast cancer stage I.  REFERRING PROVIDER: No ref. provider found  HPI: Patient is a 80 year old female now out 6 months having completed radiation therapy to the left breast for a T1 N0 M0 invasive mammary carcinoma ER/PR positive HER-2/neu not overexpressed status post whole breast radiation. She is currently on aromatase inhibitor tolerating that well without side effect. She has an appointment with her surgeon next week who will be ordering her follow-up mammograms. She specifically denies breast tenderness cough or bone pain.  COMPLICATIONS OF TREATMENT: none  FOLLOW UP COMPLIANCE: keeps appointments   PHYSICAL EXAM:  BP 145/78 mmHg  Pulse 79  Temp(Src) 98.8 F (37.1 C)  Resp 20  Ht $R'5\' 4"'is$  (1.626 m)  Wt 284 lb 2.8 oz (128.9 kg)  BMI 48.75 kg/m2 Lungs are clear to A&P cardiac examination essentially unremarkable with regular rate and rhythm. No dominant mass or nodularity is noted in either breast in 2 positions examined. Incision is well-healed. No axillary or supraclavicular adenopathy is appreciated. Cosmetic result is excellent. She does have some erythematous skin underneath her breasts bilaterally. Well-developed well-nourished patient in NAD. HEENT reveals PERLA, EOMI, discs not visualized.  Oral cavity is clear. No oral mucosal lesions are identified. Neck is clear without evidence of cervical or supraclavicular adenopathy. Lungs are clear to A&P. Cardiac examination is essentially unremarkable with regular rate and rhythm without murmur rub or thrill. Abdomen is benign with no organomegaly or masses noted. Motor sensory and DTR levels are equal and symmetric in the upper and lower extremities. Cranial nerves II through XII are grossly intact. Proprioception is intact. No peripheral adenopathy  or edema is identified. No motor or sensory levels are noted. Crude visual fields are within normal range.   RADIOLOGY RESULTS: Follow-up mammograms will be ordered  PLAN: At the present time she is doing well with no evidence of disease. I'm please were overall progress. She continues on aromatase inhibitor therapy without side effect. She continues close follow-up care by Dr. Azalia Bilis. I have asked to see her back in 1 year for follow-up. I've instructed the patient to use some cornstarch as well as possible anti-fungal cream under her breast for the most likely fungal infection causing some erythema in the inframammary folds bilaterally. Patient is to call sooner with any concerns.    Armstead Peaks., MD

## 2015-02-20 ENCOUNTER — Other Ambulatory Visit: Payer: Self-pay | Admitting: Family Medicine

## 2015-02-20 DIAGNOSIS — Z1231 Encounter for screening mammogram for malignant neoplasm of breast: Secondary | ICD-10-CM

## 2015-03-03 NOTE — Progress Notes (Signed)
[place]  Telephone:(336) [phone] Fax:(336) [phone]     ID: Danielle Bennett OB: August 06, 1934  MR#: [id number]  VVK#:[id number]  No care team member to display  CHIEF COMPLAINT/DIAGNOSIS:  80 year old female status post wide local excision and sentinel node biopsy on 04/26/14 for stage I (pT1 pN0 (sn) cM0) invasive mammary carcinoma left upper outer quadrant breast. (underwent mammographic guided biopsy showing DCIS overall grade 2. She did have a 1.1 mm focus of invasive carcinoma ER/PR positive HER-2/neu +3. 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 2 sentinel lymph nodes were negative).  ER/PR positive, HER-2/neu overexpressed. Completed adjuvant whole breast radiation in Jan 2016.  Started adjuvant hormonal therapy with Anastrozole in February 2016.      HISTORY OF PRESENT ILLNESS: Patient returns for continued oncology follow-up, she was seen about 6 months ago. States that she is doing about the same, chronic fatigability and dyspnea on exertion is unchanged. States that chronic arthritis is seen, denies any new bone pains. She takes calcium plus vitamin D for osteopenia. States that she ambulates minimally and slowly due to obesity and medical problems, uses a wheelchair for covering longer distances. Otherwise, appetite is good, no unintentional weight loss. Denies any side effects from anastrozole. Denies feeling new breast masses on self exam.   REVIEW OF SYSTEMS:   ROS As in HPI above. In addition, no fever, chills or sweats. No new headaches or focal weakness.  No new mood disturbances. No sore throat or dysphagia. Denies hemoptysis or chest pain. No abdominal pain, constipation, diarrhea, dysuria or hematuria. No new skin rash or bleeding symptoms. No new paresthesias in extremities. No polyuria polydipsia. PS ECOG 2.  PAST MEDICAL HISTORY: Reviewed. Past Medical  History  Diagnosis Date  . Breast cancer   . Diabetes mellitus without complication   . Thyroid disease           Diabetes mellitus   Hypercholesterolemia  GERD   HTN  Morbid obesity  Hiatal hernia  Hypothyroidism  Osteopenia  Rosacea  h/o right    PAST SURGICAL HISTORY: Reviewed. As above.  FAMILY HISTORY: Reviewed. Positive for mother with brain tumor, brother with cardiac vascular heart disease and father with both asthma and Parkinson's.  SOCIAL HISTORY: Reviewed. Denies smoking,  alcohol usage or recreational drug usage. Limited physical activity due to obestiy and medical problems.   Allergies  Allergen Reactions  . Simvastatin     Other reaction(s): Other (See Comments) Myalgias    Current Outpatient Prescriptions  Medication Sig Dispense Refill  . anastrozole (ARIMIDEX) 1 MG tablet Take 1 tablet (1 mg total) by mouth daily. 90 tablet 3  . atorvastatin (LIPITOR) 40 MG tablet TAKE ONE TABLET BY MOUTH ONCE DAILY AT NIGHT TIME.    . betamethasone valerate (VALISONE) 0.1 % cream Apply to skin as directed prn for itch    . furosemide (LASIX) 20 MG tablet Take by mouth.    Marland Kitchen glipiZIDE (GLUCOTROL XL) 10 MG 24 hr tablet TAKE 1 TABLET (10 MG TOTAL) BY MOUTH 2 (TWO) TIMES DAILY.    Marland Kitchen insulin lispro (HUMALOG) 100 UNIT/ML KiwkPen Use 2-7 units before each meal as directed    . levothyroxine (SYNTHROID, LEVOTHROID) 137 MCG tablet Take by mouth.    . Liraglutide (VICTOZA) 18 MG/3ML SOPN Inject into the skin.    Marland Kitchen lisinopril (PRINIVIL,ZESTRIL) 10 MG tablet Take by mouth.    Marland Kitchen  metFORMIN (GLUCOPHAGE) 1000 MG tablet Take by mouth.    . metroNIDAZOLE (METROCREAM) 0.75 % cream Apply topically.    . pioglitazone (ACTOS) 30 MG tablet TAKE 1 TABLET BY MOUTH ONCE A DAY    . ranitidine (ZANTAC) 150 MG tablet TAKE ONE TABLET BY MOUTH TWICE DAILY     No current facility-administered medications for this visit.    PHYSICAL EXAM: ECOG FS:2 - Symptomatic, <50% confined to bed GENERAL:  Alert and oriented and in no acute distress. No icterus. HEENT: EOMs intact. No cervical lymphadenopathy. CVS: S1S2, regular LUNGS: Bilaterally clear to auscultation, no rhonchi. ABDOMEN: Soft, nontender. No hepatomegaly.  EXTREMITIES: No pedal edema. BREASTS: no dominant masses in either breast. No axillary adenopathy on either side. Exam performed in presence of a nurse   LAB RESULTS:    Component Value Date/Time   NA 142 04/21/2014 1308   K 4.1 04/21/2014 1308   CL 104 04/21/2014 1308   CO2 26 04/21/2014 1308   GLUCOSE 137* 04/21/2014 1308   BUN 12 04/21/2014 1308   CREATININE 0.77 02/15/2015 1006   CREATININE 0.86 04/21/2014 1308   CALCIUM 9.2 04/21/2014 1308   PROT 6.4* 02/15/2015 1006   ALBUMIN 3.2* 02/15/2015 1006   AST 20 02/15/2015 1006   ALT 17 02/15/2015 1006   ALKPHOS 68 02/15/2015 1006   BILITOT 0.4 02/15/2015 1006   GFRNONAA >60 02/15/2015 1006   GFRNONAA >60 04/21/2014 1308   GFRNONAA 58* 02/04/2014 1036   GFRAA >60 02/15/2015 1006   GFRAA >60 04/21/2014 1308   GFRAA >60 02/04/2014 1036    Lab Results  Component Value Date   WBC 7.5 02/15/2015   NEUTROABS 5.8 02/15/2015   HGB 12.5 02/15/2015   HCT 37.3 02/15/2015   MCV 91.0 02/15/2015   PLT 282 02/15/2015     STUDIES: 03/18/14 - Mammogram. IMPRESSION: Further evaluation is suggested for possible asymmetry and calcifications in the left breast. RECOMMENDATION: Diagnostic mammogram and possibly ultrasound of the left breast. BI-RADS CATEGORY  0: Incomplete. Need additional imaging evaluation and/or prior mammograms for comparison.  04/05/14 - Pathology Report. LEFT BREAST, STEREOTACTIC CORE BIOPSY: INVASIVE MAMMARY CARCINOMA OF NO SPECIAL TYPE, 1.1 MM, IN A BACKGROUND OF DUCTAL CARCINOMA IN SITU (DCIS). CALCIFICATIONS ASSOCIATED WITH DCIS.  04/26/14 - Pathology Report. A. LEFT BREAST; NEEDLE-LOCALIZED PARTIAL MASTECTOMY: DUCTAL CARCINOMA IN SITU, NUCLEAR GRADE 2, CRIBRIFORM TYPE WITH FOCAL LOBULAR  CYTOLOGY. MICROCALCIFICATIONS ASSOCIATED WITH DUCTAL CARCINOMA IN SITU AND FIBROCYSTIC CHANGES. THE MARGINS OF EXCISION ARE FOCALLY CLOSE (< 0.5 MM FROM SUPERIOR) BUT NEGATIVE. BIOPSY SITE CHANGES, MARKER MATERIAL PRESENT. NEGATIVE FOR INVASIVE CARCINOMA, SEE COMMENT. B. LEFT AXILLARY LYMPH NODES; REGIONAL RESECTION: NO TUMOR SEEN IN TWO LYMPH NODES (0/2). Comment: Residual invasive carcinoma is not present in this material. pT1a  pN0  pM-not applicable (size of tumor was 1.1 mm)   ASSESSMENT / PLAN:   61. 80 year old female s/p wide local excision and sentinel node biopsy on 04/26/14 for stage I (pT1 pN0 (sn) cM0) invasive mammary carcinoma left upper outer quadrant breast (underwent mammographic guided biopsy showing DCIS overall grade 2. She did have a 1.1 mm focus of invasive carcinoma ER/PR positive, HER-2/neu +3. 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 2 sentinel lymph nodes were negative). ER/PR positive HER-2/neu overexpressed. Completed adjuvant whole breast radiation in Jan 2016. Started anastrozole February 2016  -    reviewed labs and discussed  with patient. Concurrently during study without any evidence to suggest recurrent/metastatic breast cancer. Tolerating adjuvant hormone therapy fairly well without major side effects and the continue anastrozole 1 mg by mouth daily, this is planned for duration of 5 years. Patient states that she has surveillance mammogram scheduled, and visits with Dr. Baruch Gouty in a few months. Will therefore see her back in February 2017 with repeat labs for continued surveillance. 2. History of osteopenia - continue calcium plus vitamin D. Patient states that follow-up DEXA scan has been planned towards end of this year.  3. In between visits, patient advised to call in case of any breast masses felt on self-exam, new side effects from anatrozole or other symptoms and will be  evaluated sooner. She is agreeable to this plan.    Leia Alf, MD   03/03/2015 5:20 PM

## 2015-08-03 ENCOUNTER — Encounter: Payer: Self-pay | Admitting: Internal Medicine

## 2015-08-03 ENCOUNTER — Inpatient Hospital Stay (HOSPITAL_BASED_OUTPATIENT_CLINIC_OR_DEPARTMENT_OTHER): Payer: Medicare Other | Admitting: Internal Medicine

## 2015-08-03 ENCOUNTER — Other Ambulatory Visit: Payer: Self-pay | Admitting: *Deleted

## 2015-08-03 ENCOUNTER — Inpatient Hospital Stay: Payer: Medicare Other | Attending: Internal Medicine

## 2015-08-03 VITALS — BP 156/74 | HR 92 | Temp 99.4°F | Resp 20 | Ht 64.0 in | Wt 277.6 lb

## 2015-08-03 DIAGNOSIS — C50412 Malignant neoplasm of upper-outer quadrant of left female breast: Secondary | ICD-10-CM

## 2015-08-03 DIAGNOSIS — K449 Diaphragmatic hernia without obstruction or gangrene: Secondary | ICD-10-CM

## 2015-08-03 DIAGNOSIS — K219 Gastro-esophageal reflux disease without esophagitis: Secondary | ICD-10-CM

## 2015-08-03 DIAGNOSIS — Z170 Estrogen receptor positive status [ER+]: Secondary | ICD-10-CM | POA: Insufficient documentation

## 2015-08-03 DIAGNOSIS — Z7984 Long term (current) use of oral hypoglycemic drugs: Secondary | ICD-10-CM

## 2015-08-03 DIAGNOSIS — R609 Edema, unspecified: Secondary | ICD-10-CM | POA: Insufficient documentation

## 2015-08-03 DIAGNOSIS — C50912 Malignant neoplasm of unspecified site of left female breast: Secondary | ICD-10-CM

## 2015-08-03 DIAGNOSIS — I10 Essential (primary) hypertension: Secondary | ICD-10-CM

## 2015-08-03 DIAGNOSIS — E119 Type 2 diabetes mellitus without complications: Secondary | ICD-10-CM | POA: Diagnosis not present

## 2015-08-03 DIAGNOSIS — Z808 Family history of malignant neoplasm of other organs or systems: Secondary | ICD-10-CM

## 2015-08-03 DIAGNOSIS — Z794 Long term (current) use of insulin: Secondary | ICD-10-CM | POA: Diagnosis not present

## 2015-08-03 DIAGNOSIS — M818 Other osteoporosis without current pathological fracture: Secondary | ICD-10-CM

## 2015-08-03 DIAGNOSIS — Z923 Personal history of irradiation: Secondary | ICD-10-CM | POA: Insufficient documentation

## 2015-08-03 DIAGNOSIS — E7800 Pure hypercholesterolemia, unspecified: Secondary | ICD-10-CM | POA: Insufficient documentation

## 2015-08-03 DIAGNOSIS — Z79899 Other long term (current) drug therapy: Secondary | ICD-10-CM | POA: Insufficient documentation

## 2015-08-03 DIAGNOSIS — E039 Hypothyroidism, unspecified: Secondary | ICD-10-CM | POA: Insufficient documentation

## 2015-08-03 DIAGNOSIS — Z79811 Long term (current) use of aromatase inhibitors: Secondary | ICD-10-CM | POA: Diagnosis not present

## 2015-08-03 DIAGNOSIS — E669 Obesity, unspecified: Secondary | ICD-10-CM | POA: Diagnosis not present

## 2015-08-03 LAB — COMPREHENSIVE METABOLIC PANEL
ALBUMIN: 3.7 g/dL (ref 3.5–5.0)
ALK PHOS: 74 U/L (ref 38–126)
ALT: 23 U/L (ref 14–54)
ANION GAP: 10 (ref 5–15)
AST: 23 U/L (ref 15–41)
BUN: 14 mg/dL (ref 6–20)
CALCIUM: 9.1 mg/dL (ref 8.9–10.3)
CHLORIDE: 101 mmol/L (ref 101–111)
CO2: 25 mmol/L (ref 22–32)
Creatinine, Ser: 0.8 mg/dL (ref 0.44–1.00)
GFR calc non Af Amer: >60 mL/min (ref >60)
GLUCOSE: 240 mg/dL — AB (ref 65–99)
Potassium: 3.9 mmol/L (ref 3.5–5.1)
SODIUM: 136 mmol/L (ref 135–145)
Total Bilirubin: 0.7 mg/dL (ref 0.3–1.2)
Total Protein: 7.3 g/dL (ref 6.5–8.1)

## 2015-08-03 LAB — CBC WITH DIFFERENTIAL/PLATELET
BASOS PCT: 1 %
Basophils Absolute: 0.1 10*3/uL (ref 0–0.1)
EOS ABS: 0.2 10*3/uL (ref 0–0.7)
EOS PCT: 2 %
HCT: 39.3 % (ref 35.0–47.0)
HEMOGLOBIN: 13.1 g/dL (ref 12.0–16.0)
Lymphocytes Relative: 21 %
Lymphs Abs: 1.9 10*3/uL (ref 1.0–3.6)
MCH: 30.7 pg (ref 26.0–34.0)
MCHC: 33.4 g/dL (ref 32.0–36.0)
MCV: 92 fL (ref 80.0–100.0)
Monocytes Absolute: 0.5 10*3/uL (ref 0.2–0.9)
Monocytes Relative: 5 %
NEUTROS PCT: 71 %
Neutro Abs: 6.5 10*3/uL (ref 1.4–6.5)
PLATELETS: 285 10*3/uL (ref 150–440)
RBC: 4.27 MIL/uL (ref 3.80–5.20)
RDW: 15.1 % — ABNORMAL HIGH (ref 11.5–14.5)
WBC: 9.2 10*3/uL (ref 3.6–11.0)

## 2015-08-03 NOTE — Progress Notes (Signed)
[place]  Telephone:(336) [phone] Fax:(336) [phone]     ID: Danielle Bennett OB: Nov 12, 1934  MR#: [id number]  GHW#:[id number]  No care team member to display  CHIEF COMPLAINT/DIAGNOSIS:  81 year old female status post wide local excision and sentinel node biopsy on 04/26/14 for stage I (pT1 pN0 (sn) cM0) invasive mammary carcinoma left upper outer quadrant breast. (underwent mammographic guided biopsy showing DCIS overall grade 2. She did have a 1.1 mm focus of invasive carcinoma ER/PR positive HER-2/neu +3. 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 2 sentinel lymph nodes were negative).  ER/PR positive, HER-2/neu overexpressed. Completed adjuvant whole breast radiation in Jan 2016.  Started adjuvant hormonal therapy with Anastrozole in February 2016.      HISTORY OF PRESENT ILLNESS: Danielle Bennett returns to our clinic for a 6-m follow- up. She was recently started on Fosamax for osteoporosis, and is tolerating it well. She fell in the bathroom recently and has a bruise on R arm, but denies any arm pain or reduced ROM. She denies breast masses, breast pain, nipple discharge, arthralgias, N/V/D/C, bleeding. She walks with a walker and uses wheelchair for long- distance travel. She has chronic LE edema.  REVIEW OF SYSTEMS:   ROS As in HPI above. In addition, no fever, chills or sweats. No new headaches or focal weakness.  No new mood disturbances. No sore throat or dysphagia. Denies hemoptysis or chest pain. No new skin rash or bleeding symptoms. No new paresthesias in extremities. No polyuria polydipsia. PS ECOG 2.  PAST MEDICAL HISTORY: Reviewed. Past Medical History  Diagnosis Date  . Breast cancer   . Diabetes mellitus without complication   . Thyroid disease           Diabetes mellitus   Hypercholesterolemia  GERD   HTN  Morbid obesity  Hiatal  hernia  Hypothyroidism  Osteopenia  Rosacea  h/o right    PAST SURGICAL HISTORY: Reviewed. As above.  FAMILY HISTORY: Reviewed. Positive for mother with brain tumor, brother with cardiac vascular heart disease and father with both asthma and Parkinson's.  SOCIAL HISTORY: Reviewed. Denies smoking,  alcohol usage or recreational drug usage. Limited physical activity due to obestiy and medical problems.   Allergies  Allergen Reactions  . Simvastatin     Other reaction(s): Other (See Comments) Myalgias    Current Outpatient Prescriptions  Medication Sig Dispense Refill  . alendronate (FOSAMAX) 70 MG tablet Take 70 mg by mouth once a week. Take with a full glass of water on an empty stomach.    Marland Kitchen anastrozole (ARIMIDEX) 1 MG tablet Take 1 tablet (1 mg total) by mouth daily. 90 tablet 3  . atorvastatin (LIPITOR) 40 MG tablet TAKE ONE TABLET BY MOUTH ONCE DAILY AT NIGHT TIME.    . betamethasone valerate (VALISONE) 0.1 % cream Apply to skin as directed prn for itch    . furosemide (LASIX) 20 MG tablet Take 20 mg by mouth daily as needed.     Marland Kitchen glipiZIDE (GLUCOTROL XL) 10 MG 24 hr tablet TAKE 1 TABLET (10 MG TOTAL) BY MOUTH 2 (TWO) TIMES DAILY.    Marland Kitchen insulin lispro (HUMALOG) 100 UNIT/ML KiwkPen Use 2-7 units before each meal as directed    . levothyroxine (SYNTHROID, LEVOTHROID) 137 MCG tablet Take 137 mcg by mouth daily.     . Liraglutide (VICTOZA) 18 MG/3ML SOPN Inject 18 mg into the  skin daily.     Marland Kitchen lisinopril (PRINIVIL,ZESTRIL) 10 MG tablet Take 10 mg by mouth daily.     . metFORMIN (GLUCOPHAGE) 1000 MG tablet Take 1,000 mg by mouth 2 (two) times daily with a meal.     . metroNIDAZOLE (METROCREAM) 0.75 % cream Apply 1 application topically daily.     . pioglitazone (ACTOS) 30 MG tablet TAKE 1 TABLET BY MOUTH ONCE A DAY    . ranitidine (ZANTAC) 150 MG tablet TAKE ONE TABLET BY MOUTH TWICE DAILY     No current facility-administered medications for this visit.    PHYSICAL  EXAM: Filed Vitals:   08/03/15 1036  BP: 156/74  Pulse: 92  Temp: 99.4 F (37.4 C)  Resp: 20   Body mass index is 47.62 kg/(m^2).  ECOG FS:2 - Symptomatic, <50% confined to bed GENERAL: Alert and oriented and in no acute distress. Morbidly obese Caucasian female. No icterus. HEENT: EOMs intact. No cervical lymphadenopathy. CVS: S1S2, regular LUNGS: Bilaterally clear to auscultation, no rhonchi. Skin: L upper arm ecchymosis ABDOMEN: Soft, nontender. No hepatomegaly.  EXTREMITIES: Non- pitting edema b/l, L>R with chronic hyperpigmentation. BREASTS: Right- smaller than left, post-Sx scar, palpable 3x2 cm tender hard mass, underlying the scar, no axillary LAN. Left- post-Sx scar, no masses, no discharge, no axillary LAN. Exam performed in presence of a nurse   LAB RESULTS:    Component Value Date/Time   NA 136 08/03/2015 1008   NA 142 04/21/2014 1308   K 3.9 08/03/2015 1008   K 4.1 04/21/2014 1308   CL 101 08/03/2015 1008   CL 104 04/21/2014 1308   CO2 25 08/03/2015 1008   CO2 26 04/21/2014 1308   GLUCOSE 240* 08/03/2015 1008   GLUCOSE 137* 04/21/2014 1308   BUN 14 08/03/2015 1008   BUN 12 04/21/2014 1308   CREATININE 0.80 08/03/2015 1008   CREATININE 0.86 04/21/2014 1308   CALCIUM 9.1 08/03/2015 1008   CALCIUM 9.2 04/21/2014 1308   PROT 7.3 08/03/2015 1008   ALBUMIN 3.7 08/03/2015 1008   AST 23 08/03/2015 1008   ALT 23 08/03/2015 1008   ALKPHOS 74 08/03/2015 1008   BILITOT 0.7 08/03/2015 1008   GFRNONAA >60 08/03/2015 1008   GFRNONAA >60 04/21/2014 1308   GFRNONAA 58* 02/04/2014 1036   GFRAA >60 08/03/2015 1008   GFRAA >60 04/21/2014 1308   GFRAA >60 02/04/2014 1036    Lab Results  Component Value Date   WBC 9.2 08/03/2015   NEUTROABS 6.5 08/03/2015   HGB 13.1 08/03/2015   HCT 39.3 08/03/2015   MCV 92.0 08/03/2015   PLT 285 08/03/2015     STUDIES: 03/18/14 - Mammogram. IMPRESSION: Further evaluation is suggested for possible asymmetry and calcifications  in the left breast. RECOMMENDATION: Diagnostic mammogram and possibly ultrasound of the left breast. BI-RADS CATEGORY  0: Incomplete. Need additional imaging evaluation and/or prior mammograms for comparison.  04/05/14 - Pathology Report. LEFT BREAST, STEREOTACTIC CORE BIOPSY: INVASIVE MAMMARY CARCINOMA OF NO SPECIAL TYPE, 1.1 MM, IN A BACKGROUND OF DUCTAL CARCINOMA IN SITU (DCIS). CALCIFICATIONS ASSOCIATED WITH DCIS.  04/26/14 - Pathology Report. A. LEFT BREAST; NEEDLE-LOCALIZED PARTIAL MASTECTOMY: DUCTAL CARCINOMA IN SITU, NUCLEAR GRADE 2, CRIBRIFORM TYPE WITH FOCAL LOBULAR CYTOLOGY. MICROCALCIFICATIONS ASSOCIATED WITH DUCTAL CARCINOMA IN SITU AND FIBROCYSTIC CHANGES. THE MARGINS OF EXCISION ARE FOCALLY CLOSE (< 0.5 MM FROM SUPERIOR) BUT NEGATIVE. BIOPSY SITE CHANGES, MARKER MATERIAL PRESENT. NEGATIVE FOR INVASIVE CARCINOMA, SEE COMMENT. B. LEFT AXILLARY LYMPH NODES; REGIONAL RESECTION: NO TUMOR SEEN IN TWO LYMPH  NODES (0/2). Comment: Residual invasive carcinoma is not present in this material. pT1a  pN0  pM-not applicable (size of tumor was 1.1 mm)   ASSESSMENT / PLAN:   59. 81 year old female s/p wide local excision and sentinel node biopsy on 04/26/14 for stage I (pT1 pN0 (sn) cM0) invasive mammary carcinoma left upper outer quadrant breast (underwent mammographic guided biopsy showing DCIS overall grade 2 with a 1.1 mm focus of invasive carcinoma ER/PR positive, HER-2/neu +3. 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 2 sentinel lymph nodes were negative)- Completed adjuvant whole breast radiation in Jan 2016. Started anastrozole February 2016  -   Tolerated well, but developed osteoporosis. reviewed labs and discussed with patient. At this point there is no evidence to suggest recurrent/metastatic breast cancer. R breast hard nodule is reported as chronic since 1990s post Sx, previously biopsied (scar), without  change in size. Tolerating adjuvant hormone therapy fairly well - continue anastrozole 1 mg by mouth daily, this is planned for duration of 5 years. She has not had mammogram since 2015- we will request mammogram. 2. Osteoporosis - previously was on Fosamax, and was restarted on Fosamax last month, tolerating well. Will continue calcium plus vitamin D. She may need another DEXA scan in 1 year to monitor bone density. 3. RTC in 6 months. In between visits, patient advised to call in case of any breast masses felt on self-exam, new side effects from anatrozole or other symptoms and will be evaluated sooner. She is agreeable to this plan.    Roxana Hires, MD   08/03/2015 10:10 AM

## 2015-08-22 ENCOUNTER — Other Ambulatory Visit: Payer: Self-pay | Admitting: Internal Medicine

## 2015-08-22 ENCOUNTER — Ambulatory Visit
Admission: RE | Admit: 2015-08-22 | Discharge: 2015-08-22 | Disposition: A | Discharge status: Home / Self Care | Payer: Medicare Other | Source: Ambulatory Visit | Attending: Internal Medicine | Admitting: Internal Medicine

## 2015-08-22 DIAGNOSIS — C50912 Malignant neoplasm of unspecified site of left female breast: Secondary | ICD-10-CM

## 2015-08-22 DIAGNOSIS — Z9889 Other specified postprocedural states: Secondary | ICD-10-CM | POA: Diagnosis not present

## 2015-08-22 DIAGNOSIS — Z853 Personal history of malignant neoplasm of breast: Secondary | ICD-10-CM | POA: Insufficient documentation

## 2016-02-01 ENCOUNTER — Ambulatory Visit: Payer: Medicare Other | Admitting: Family Medicine

## 2016-02-01 ENCOUNTER — Other Ambulatory Visit: Payer: Medicare Other

## 2016-02-06 ENCOUNTER — Other Ambulatory Visit: Payer: Self-pay

## 2016-02-06 DIAGNOSIS — C50912 Malignant neoplasm of unspecified site of left female breast: Secondary | ICD-10-CM

## 2016-02-07 ENCOUNTER — Inpatient Hospital Stay (HOSPITAL_BASED_OUTPATIENT_CLINIC_OR_DEPARTMENT_OTHER): Payer: Medicare Other | Admitting: Hematology and Oncology

## 2016-02-07 ENCOUNTER — Encounter: Payer: Self-pay | Admitting: Hematology and Oncology

## 2016-02-07 ENCOUNTER — Inpatient Hospital Stay: Payer: Medicare Other | Attending: Hematology and Oncology

## 2016-02-07 VITALS — BP 145/78 | HR 96 | Temp 98.7°F | Resp 18 | Wt 281.0 lb

## 2016-02-07 DIAGNOSIS — Z923 Personal history of irradiation: Secondary | ICD-10-CM | POA: Insufficient documentation

## 2016-02-07 DIAGNOSIS — Z79811 Long term (current) use of aromatase inhibitors: Secondary | ICD-10-CM

## 2016-02-07 DIAGNOSIS — M818 Other osteoporosis without current pathological fracture: Secondary | ICD-10-CM | POA: Insufficient documentation

## 2016-02-07 DIAGNOSIS — Z7984 Long term (current) use of oral hypoglycemic drugs: Secondary | ICD-10-CM | POA: Insufficient documentation

## 2016-02-07 DIAGNOSIS — Z800 Family history of malignant neoplasm of digestive organs: Secondary | ICD-10-CM

## 2016-02-07 DIAGNOSIS — Z794 Long term (current) use of insulin: Secondary | ICD-10-CM | POA: Insufficient documentation

## 2016-02-07 DIAGNOSIS — M810 Age-related osteoporosis without current pathological fracture: Secondary | ICD-10-CM

## 2016-02-07 DIAGNOSIS — Z79899 Other long term (current) drug therapy: Secondary | ICD-10-CM

## 2016-02-07 DIAGNOSIS — E119 Type 2 diabetes mellitus without complications: Secondary | ICD-10-CM

## 2016-02-07 DIAGNOSIS — Z8042 Family history of malignant neoplasm of prostate: Secondary | ICD-10-CM | POA: Diagnosis not present

## 2016-02-07 DIAGNOSIS — E079 Disorder of thyroid, unspecified: Secondary | ICD-10-CM | POA: Insufficient documentation

## 2016-02-07 DIAGNOSIS — K219 Gastro-esophageal reflux disease without esophagitis: Secondary | ICD-10-CM

## 2016-02-07 DIAGNOSIS — Z8051 Family history of malignant neoplasm of kidney: Secondary | ICD-10-CM | POA: Diagnosis not present

## 2016-02-07 DIAGNOSIS — Z170 Estrogen receptor positive status [ER+]: Secondary | ICD-10-CM | POA: Insufficient documentation

## 2016-02-07 DIAGNOSIS — C50412 Malignant neoplasm of upper-outer quadrant of left female breast: Secondary | ICD-10-CM | POA: Insufficient documentation

## 2016-02-07 DIAGNOSIS — Z853 Personal history of malignant neoplasm of breast: Secondary | ICD-10-CM | POA: Insufficient documentation

## 2016-02-07 DIAGNOSIS — C50912 Malignant neoplasm of unspecified site of left female breast: Secondary | ICD-10-CM

## 2016-02-07 DIAGNOSIS — D0511 Intraductal carcinoma in situ of right breast: Secondary | ICD-10-CM

## 2016-02-07 LAB — CBC WITH DIFFERENTIAL/PLATELET
Basophils Absolute: 0.1 10*3/uL (ref 0–0.1)
Basophils Relative: 1 %
Eosinophils Absolute: 0.2 10*3/uL (ref 0–0.7)
Eosinophils Relative: 2 %
HCT: 39.8 % (ref 35.0–47.0)
Hemoglobin: 13.2 g/dL (ref 12.0–16.0)
Lymphocytes Relative: 23 %
Lymphs Abs: 2.2 10*3/uL (ref 1.0–3.6)
MCH: 30.2 pg (ref 26.0–34.0)
MCHC: 33.2 g/dL (ref 32.0–36.0)
MCV: 90.8 fL (ref 80.0–100.0)
Monocytes Absolute: 0.6 10*3/uL (ref 0.2–0.9)
Monocytes Relative: 6 %
Neutro Abs: 6.8 10*3/uL — ABNORMAL HIGH (ref 1.4–6.5)
Neutrophils Relative %: 68 %
Platelets: 264 10*3/uL (ref 150–440)
RBC: 4.39 MIL/uL (ref 3.80–5.20)
RDW: 14.6 % — ABNORMAL HIGH (ref 11.5–14.5)
WBC: 9.9 10*3/uL (ref 3.6–11.0)

## 2016-02-07 LAB — COMPREHENSIVE METABOLIC PANEL
ALT: 22 U/L (ref 14–54)
AST: 17 U/L (ref 15–41)
Albumin: 3.6 g/dL (ref 3.5–5.0)
Alkaline Phosphatase: 62 U/L (ref 38–126)
Anion gap: 10 (ref 5–15)
BUN: 13 mg/dL (ref 6–20)
CO2: 26 mmol/L (ref 22–32)
Calcium: 9.4 mg/dL (ref 8.9–10.3)
Chloride: 101 mmol/L (ref 101–111)
Creatinine, Ser: 0.77 mg/dL (ref 0.44–1.00)
GFR calc Af Amer: >60 mL/min (ref >60)
GFR calc non Af Amer: >60 mL/min (ref >60)
Glucose, Bld: 228 mg/dL — ABNORMAL HIGH (ref 65–99)
Potassium: 4.7 mmol/L (ref 3.5–5.1)
Sodium: 137 mmol/L (ref 135–145)
Total Bilirubin: 0.6 mg/dL (ref 0.3–1.2)
Total Protein: 6.9 g/dL (ref 6.5–8.1)

## 2016-02-07 MED ORDER — ANASTROZOLE 1 MG PO TABS: 1.0000 mg | ORAL_TABLET | Freq: Every day | ORAL | 3 refills | Status: DC

## 2016-02-07 NOTE — Progress Notes (Signed)
[facility] day:  02/07/16  Chief Complaint: Danielle Bennett is a 81 y.o. female with a history of multi-focal right breast DCIS (1995) and stage IA Her2/neu + left breast cancer (2015) who is seen for reassessment.  HPI:  The patient was diagnosed with multifocal DCIS of the right breast in 1995.  She underwent biopsy on 02/01/1994 by Dr. Rochel Brome.  Pathology revealed no evidence of lymphatic/vascular invasion.  Microcalcifications were present in association with the tumor.  There were close margins (less than 1 mm).  She underwent wide excision on 03/01/1994 by Dr. Siri Cole at [facility].  There was no gross evidence of malignancy with a foreign body giant cell reaction.  She received radiation.  She did not receive hormonal therapy.  The patient was diagnosed with breast cancer in 2015.  She presented with an abnormal mammogram.  Mammogram on 03/18/2014 revealed asymmetry and calcifications in the left breast.  Stereotactic core biopsy on 04/05/2014 revealed invasive mammary carcinoma of no special type (1.1 mm) in a background of DCIS.  Calcifications were associated with DCIS.  She underwent wide excision of the mass in the left upper outer quadrant by Dr. Dia Crawford on 04/26/2014.  Pathology revealed grade II DCIS, cribriform type with focal lobular cytology.  Microcalcifications were associated with DCIS and fibrocystic changes.  There was no residual invasive carcinoma.  Margins were close (< 0.5 mm).  No tumor was seen in 2 lymph nodes.  Tumor was ER positive (> 90%), PR positive (90%), and Her2/neu 3+.  Pathologic stage was T1aN0(sn) M0.  She completed adjuvant left whole breast radiation in 06/2014.  She started Arimidex in 07/2014.  Bilateral mammogram on 08/22/2015 revealed no evidence of malignancy.  Bone density study on 06/27/2015 revealed osteoporosis with a T score of -3.2 in the left femoral neck, -2.5 in the hip and -2.4 in the  left forearm.  She was previously on Fosamax, but discontinued for an unspecified period of time.  She restarted Fosamax in 06/2015.  She is on calcium and vitamin D.  Symptomatically, she notes a sore on her right buttock. She denies any breast concerns. She denies any pain. She is taking Arimidex.   Past Medical History:  Diagnosis Date  . Breast cancer ([place]) 1995   right breast cancer  . Breast cancer ([place]) 2015   left breast cancer  . Diabetes mellitus without complication ([place])   . GERD (gastroesophageal reflux disease)   . Thyroid disease     Past Surgical History:  Procedure Laterality Date  . BREAST LUMPECTOMY Right 1995  . BREAST LUMPECTOMY Left 04/2014    Family History  Problem Relation Age of Onset  . Heart attack Mother   . Kidney cancer Brother   . Prostate cancer Brother   . Esophageal cancer Brother     Social History:  reports that she has never smoked. She has never used smokeless tobacco. She reports that she does not drink alcohol or use drugs.  She lives [place]. 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  She has family members who live on either side of her.  The patient is accompanied by Konrad Dolores and Kris Mouton today.  Allergies:  Allergies  Allergen Reactions  . Simvastatin     Other reaction(s): Other (See Comments) Myalgias    Current Medications: Current Outpatient Prescriptions  Medication Sig Dispense Refill  . alendronate (FOSAMAX) 70 MG tablet Take  70 mg by mouth once a week. Take with a full glass of water on an empty stomach.    Marland Kitchen anastrozole (ARIMIDEX) 1 MG tablet Take 1 tablet (1 mg total) by mouth daily. 90 tablet 3  . atorvastatin (LIPITOR) 40 MG tablet TAKE ONE TABLET BY MOUTH ONCE DAILY AT NIGHT TIME.    . betamethasone valerate (VALISONE) 0.1 % cream Apply to skin as directed prn for itch    . furosemide (LASIX) 20 MG tablet Take 20 mg by mouth daily as needed.     Marland Kitchen glipiZIDE (GLUCOTROL XL) 10  MG 24 hr tablet TAKE 1 TABLET (10 MG TOTAL) BY MOUTH 2 (TWO) TIMES DAILY.    Marland Kitchen insulin lispro (HUMALOG) 100 UNIT/ML KiwkPen Use 2-7 units before each meal as directed    . levothyroxine (SYNTHROID, LEVOTHROID) 137 MCG tablet Take 137 mcg by mouth daily.     . Liraglutide (VICTOZA) 18 MG/3ML SOPN Inject 18 mg into the skin daily.     Marland Kitchen lisinopril (PRINIVIL,ZESTRIL) 10 MG tablet Take 10 mg by mouth daily.     . metFORMIN (GLUCOPHAGE) 1000 MG tablet Take 1,000 mg by mouth 2 (two) times daily with a meal.     . metroNIDAZOLE (METROCREAM) 0.75 % cream Apply 1 application topically daily.     . pioglitazone (ACTOS) 15 MG tablet Take 15 mg by mouth daily. Take 1/2 tablet daily    . ranitidine (ZANTAC) 150 MG tablet TAKE ONE TABLET BY MOUTH TWICE DAILY     No current facility-administered medications for this visit.     Review of Systems:  GENERAL:  Feels fine.  No fevers, sweats or weight loss. PERFORMANCE STATUS (ECOG):  1 HEENT:  No visual changes, runny nose, sore throat, mouth sores or tenderness. Lungs: No shortness of breath or cough.  No hemoptysis. Cardiac:  No chest pain, palpitations, orthopnea, or PND. GI:  No nausea, vomiting, diarrhea, constipation, melena or hematochezia. GU:  No urgency, frequency, dysuria, or hematuria. Musculoskeletal:  No back pain.  No joint pain.  No muscle tenderness. Extremities:  No pain or swelling. Skin:  No rashes or skin changes. Neuro:  No headache, numbness or weakness, balance or coordination issues. Endocrine:  Diabetes on insulin.  Thyroid disease on Synthroid.  No hot flashes or night sweats. Psych:  No mood changes, depression or anxiety. Pain:  No focal pain. Review of systems:  All other systems reviewed and found to be negative.  Physical Exam: Blood pressure (!) 145/78, pulse 96, temperature 98.7 F (37.1 C), temperature source Tympanic, resp. rate 18, weight 281 lb (127.5 kg). GENERAL:  Well developed, well nourished, heavyset woman  sitting comfortably in a wheelchair in the exam room in no acute distress. MENTAL STATUS:  Alert and oriented to person, place and time. HEAD:  Rhem styled short hair.  Normocephalic, atraumatic, face symmetric, no Cushingoid features. EYES:  Pupils equal round and reactive to light and accomodation.  No conjunctivitis or scleral icterus. ENT:  Oropharynx clear without lesion.  Tongue normal. Mucous membranes moist.  RESPIRATORY:  Clear to auscultation without rales, wheezes or rhonchi. CARDIOVASCULAR:  Regular rate and rhythm without murmur, rub or gallop. ABDOMEN:  Soft, non-tender, with active bowel sounds, and no hepatosplenomegaly.  No masses. BREAST:  Right breast s/p surgery and radiation.  4 cm area of apparent scarring (no change per patient).  No new masses, skin changes or nipple discharge.  Left breast without masses, skin changes or nipple discharge.  Towel under  breast. SKIN:  Small eschar on right buttock.  No rashes. EXTREMITIES: No edema, no skin discoloration or tenderness.  No palpable cords. LYMPH NODES: No palpable cervical, supraclavicular, axillary or inguinal adenopathy  NEUROLOGICAL: Unremarkable. PSYCH:  Appropriate.   Appointment on 02/07/2016  Component Date Value Ref Range Status  . WBC 02/07/2016 9.9  3.6 - 11.0 K/uL Final  . RBC 02/07/2016 4.39  3.80 - 5.20 MIL/uL Final  . Hemoglobin 02/07/2016 13.2  12.0 - 16.0 g/dL Final  . HCT 02/07/2016 39.8  35.0 - 47.0 % Final  . MCV 02/07/2016 90.8  80.0 - 100.0 fL Final  . MCH 02/07/2016 30.2  26.0 - 34.0 pg Final  . MCHC 02/07/2016 33.2  32.0 - 36.0 g/dL Final  . RDW 02/07/2016 14.6* 11.5 - 14.5 % Final  . Platelets 02/07/2016 264  150 - 440 K/uL Final  . Neutrophils Relative % 02/07/2016 68  % Final  . Neutro Abs 02/07/2016 6.8* 1.4 - 6.5 K/uL Final  . Lymphocytes Relative 02/07/2016 23  % Final  . Lymphs Abs 02/07/2016 2.2  1.0 - 3.6 K/uL Final  . Monocytes Relative 02/07/2016 6  % Final  . Monocytes Absolute  02/07/2016 0.6  0.2 - 0.9 K/uL Final  . Eosinophils Relative 02/07/2016 2  % Final  . Eosinophils Absolute 02/07/2016 0.2  0 - 0.7 K/uL Final  . Basophils Relative 02/07/2016 1  % Final  . Basophils Absolute 02/07/2016 0.1  0 - 0.1 K/uL Final  . Sodium 02/07/2016 137  135 - 145 mmol/L Final  . Potassium 02/07/2016 4.7  3.5 - 5.1 mmol/L Final  . Chloride 02/07/2016 101  101 - 111 mmol/L Final  . CO2 02/07/2016 26  22 - 32 mmol/L Final  . Glucose, Bld 02/07/2016 228* 65 - 99 mg/dL Final  . BUN 02/07/2016 13  6 - 20 mg/dL Final  . Creatinine, Ser 02/07/2016 0.77  0.44 - 1.00 mg/dL Final  . Calcium 02/07/2016 9.4  8.9 - 10.3 mg/dL Final  . Total Protein 02/07/2016 6.9  6.5 - 8.1 g/dL Final  . Albumin 02/07/2016 3.6  3.5 - 5.0 g/dL Final  . AST 02/07/2016 17  15 - 41 U/L Final  . ALT 02/07/2016 22  14 - 54 U/L Final  . Alkaline Phosphatase 02/07/2016 62  38 - 126 U/L Final  . Total Bilirubin 02/07/2016 0.6  0.3 - 1.2 mg/dL Final  . GFR calc non Af Amer 02/07/2016 >60  >60 mL/min Final  . GFR calc Af Amer 02/07/2016 >60  >60 mL/min Final   Comment: (NOTE) The eGFR has been calculated using the CKD EPI equation. This calculation has not been validated in all clinical situations. eGFR's persistently <60 mL/min signify possible Chronic Kidney Disease.   . Anion gap 02/07/2016 10  5 - 15 Final    Assessment:  Danielle Bennett is a 81 y.o. female with multi-focal right breast DCIS (1995) and stage IA Her2/neu + left breast cancer (2015).  She was diagnosed with multifocal DCIS of the right breast in 1995.  She underwent biopsy on 02/01/1994.  Pathology revealed multi-focal DCIS with comedo necrosis.  There was no evidence of lymphatic/vascular invasion.  Microcalcifications were present.  Margins were close (less than 1 mm).  She underwent re-excision on 03/01/1994 by Dr. Siri Cole at [facility].  There was no gross evidence of malignancy.  She received radiation.  She did not receive  hormonal therapy.  She was diagnosed with Her2/neu + left breast cancer  in 2015.  Stereotactic core biopsy on 04/05/2014 revealed invasive mammary carcinoma of no special type (1.1 mm) in a background of DCIS.  Wide excision of the mass in the left upper outer quadrant on 04/26/2014 revealed grade II DCIS, cribriform type with focal lobular cytology.  There was no residual invasive carcinoma.  No tumor was seen in 2 lymph nodes.  Tumor was ER positive (> 90%), PR positive (90%), and Her2/neu 3+.  Pathologic stage was T1aN0(sn) M0.  She received adjuvant left whole breast radiation in 06/2014.  She started Arimidex in 07/2014.  Bilateral mammogram on 08/22/2015 revealed no evidence of malignancy.  CA27.29 was 19.2 on 02/07/2016.  Bone density study on 06/27/2015 revealed osteoporosis with a T score of -3.2 in the left femoral neck, -2.5 in the hip and -2.4 in the left forearm.  She was previously on Fosamax, but discontinued for an unspecified period of time.  She restarted Fosamax in 06/2015.  She is on calcium and vitamin D.  Symptomatically, she notes a sore on her right buttock.  She denies any breast concerns. She is taking Arimidex.  Plan: 1.  Review entire medical history, diagnosis and management of right breast cancer and later left breast cancer. Discuss continuation of a Rheumatrex for minimum of 5 years up. As plan for next bilateral mammogram in 07/2016. 2.  Labs today:  CBC with diff, CMP, CA27.29. 3.  Continue Arimidex. 4.  Discuss osteoporosis. Discuss continuation of Fosamax as well as calcium and vitamin D.  5.  Schedule mammogram 08/21/2016. 6.  RTC in 4 months for MD assessment and labs (CBC with diff, CMP, CA27.29).   Lequita Asal, MD  02/07/2016, 12:23 PM

## 2016-02-07 NOTE — Progress Notes (Signed)
Patient is here for follow up.  Needs refill on Anastrazole. She also mentions a sore on her right buttocks. Would like someone to take a look at it.

## 2016-02-08 LAB — CANCER ANTIGEN 27.29: CA 27.29: 19.2 U/mL (ref 0.0–38.6)

## 2016-02-14 ENCOUNTER — Other Ambulatory Visit: Payer: Medicare Other

## 2016-02-14 ENCOUNTER — Ambulatory Visit: Payer: Medicare Other | Admitting: Hematology and Oncology

## 2016-02-15 ENCOUNTER — Encounter: Payer: Self-pay | Admitting: Radiation Oncology

## 2016-02-15 ENCOUNTER — Encounter (INDEPENDENT_AMBULATORY_CARE_PROVIDER_SITE_OTHER): Payer: Self-pay

## 2016-02-15 ENCOUNTER — Ambulatory Visit
Admission: RE | Admit: 2016-02-15 | Discharge: 2016-02-15 | Disposition: A | Discharge status: Home / Self Care | Payer: Medicare Other | Source: Ambulatory Visit | Attending: Radiation Oncology | Admitting: Radiation Oncology

## 2016-02-15 VITALS — BP 154/84 | HR 80 | Temp 98.8°F | Resp 20 | Wt 281.9 lb

## 2016-02-15 DIAGNOSIS — Z923 Personal history of irradiation: Secondary | ICD-10-CM | POA: Insufficient documentation

## 2016-02-15 DIAGNOSIS — Z79811 Long term (current) use of aromatase inhibitors: Secondary | ICD-10-CM | POA: Diagnosis not present

## 2016-02-15 DIAGNOSIS — C50912 Malignant neoplasm of unspecified site of left female breast: Secondary | ICD-10-CM

## 2016-02-15 DIAGNOSIS — Z170 Estrogen receptor positive status [ER+]: Secondary | ICD-10-CM | POA: Insufficient documentation

## 2016-02-15 DIAGNOSIS — E669 Obesity, unspecified: Secondary | ICD-10-CM | POA: Diagnosis not present

## 2016-02-15 NOTE — Progress Notes (Signed)
Radiation Oncology Follow up Note  Name: Danielle Bennett   Date:   02/15/2016 MRN:  [id number] DOB: 1934-12-07    This 81 y.o. female presents to the clinic today for 18 month follow-up for stage I breast cancer.  REFERRING PROVIDER: No ref. provider found  HPI: Patient is a 81 year old female now out a year and a half having completed radiation therapy to her left breast for a T1 N0 M0 invasive mammary carcinoma ER/PR positive HER-2/neu negative. Seen today in routine follow-up she is doing well. She specifically denies breast tenderness cough or bone pain.. Mammograms back in February were BI-RADS 2 benign she scheduled for follow-up mammograms again in February 18. She is currently on anastrozole tongue that well without side effect.  COMPLICATIONS OF TREATMENT: none  FOLLOW UP COMPLIANCE: keeps appointments   PHYSICAL EXAM:  BP (!) 154/84   Pulse 80   Temp 98.8 F (37.1 C)   Resp 20   Wt 281 lb 13.7 oz (127.9 kg)   BMI 48.38 kg/m  A well-developed obese female wheelchair-bound in NAD.Lungs are clear to A&P cardiac examination essentially unremarkable with regular rate and rhythm. No dominant mass or nodularity is noted in either breast in 2 positions examined. Incision is well-healed. No axillary or supraclavicular adenopathy is appreciated. Cosmetic result is excellent. Well-developed well-nourished patient in NAD. HEENT reveals PERLA, EOMI, discs not visualized.  Oral cavity is clear. No oral mucosal lesions are identified. Neck is clear without evidence of cervical or supraclavicular adenopathy. Lungs are clear to A&P. Cardiac examination is essentially unremarkable with regular rate and rhythm without murmur rub or thrill. Abdomen is benign with no organomegaly or masses noted. Motor sensory and DTR levels are equal and symmetric in the upper and lower extremities. Cranial nerves II through XII are grossly intact. Proprioception is intact. No peripheral adenopathy or edema is  identified. No motor or sensory levels are noted. Crude visual fields are within normal range. RADIOLOGY RESULTS: Most recent mammograms are reviewed compatible with the above-stated findings  PLAN: Present time year and half out she continues to do well with no evidence of disease. Taste on her overall general condition is extremely difficult to attend doctor's appointments I'm to cutback my follow-up schedule and allow medical oncology to continue following the patient. Follow-up mammograms already been ordered. Patient knows to call with any concerns.  I would like to take this opportunity to thank you for allowing me to participate in the care of your patient.Armstead Peaks., MD

## 2016-04-07 DIAGNOSIS — M810 Age-related osteoporosis without current pathological fracture: Secondary | ICD-10-CM | POA: Insufficient documentation

## 2016-06-04 NOTE — Progress Notes (Addendum)
Danielle Bennett:  06/05/16  Chief Complaint: Danielle Bennett is a 81 y.o. female with a history of multi-focal right breast DCIS (1995) and stage IA Her2/neu + left breast cancer (2015) who is seen for 4 month assessment.  HPI:  The patient was last seen in the medical oncology clinic on 02/07/2016.  At that time, she was seen for initial assessment by me. She denied any breast concerns. She was taking Arimidex.  CBC and CMP were normal except for a blood sugar of 228.  CA27.29 was 19.2 (normal).  We discussed continuation of calcium, vitamin D, and Fosamax for osteoporosis.  During the interim, she has done well.  She denies any breast concerns.  She denies any bone pain.  She notes an area of irritation.   Past Medical History:  Diagnosis Date  . Breast cancer ([place]) 1995   right breast cancer  . Breast cancer ([place]) 2015   left breast cancer  . Diabetes mellitus without complication ([place])   . GERD (gastroesophageal reflux disease)   . Thyroid disease     Past Surgical History:  Procedure Laterality Date  . BREAST LUMPECTOMY Right 1995  . BREAST LUMPECTOMY Left 04/2014    Family History  Problem Relation Age of Onset  . Heart attack Mother   . Kidney cancer Brother   . Prostate cancer Brother   . Esophageal cancer Brother     Social History:  reports that she has never smoked. She has never used smokeless tobacco. She reports that she does not drink alcohol or use drugs.  She lives [place]. 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  She has family members who live on either side of her.  The patient is alone today.  Allergies:  Allergies  Allergen Reactions  . Simvastatin     Other reaction(s): Other (See Comments) Myalgias    Current Medications: Current Outpatient Prescriptions  Medication Sig Dispense Refill  . alendronate (FOSAMAX) 70 MG tablet Take 70 mg by mouth once a  week. Take with a full glass of water on an empty stomach.    Marland Kitchen anastrozole (ARIMIDEX) 1 MG tablet Take 1 tablet (1 mg total) by mouth daily. 90 tablet 3  . atorvastatin (LIPITOR) 40 MG tablet TAKE ONE TABLET BY MOUTH ONCE DAILY AT NIGHT TIME.    . betamethasone valerate (VALISONE) 0.1 % cream Apply to skin as directed prn for itch    . furosemide (LASIX) 20 MG tablet Take 20 mg by mouth daily as needed.     Marland Kitchen glipiZIDE (GLUCOTROL XL) 10 MG 24 hr tablet TAKE 1 TABLET (10 MG TOTAL) BY MOUTH 2 (TWO) TIMES DAILY.    Marland Kitchen insulin lispro (HUMALOG) 100 UNIT/ML KiwkPen Use 2-7 units before each meal as directed    . levothyroxine (SYNTHROID, LEVOTHROID) 137 MCG tablet Take 137 mcg by mouth daily.     Marland Kitchen lisinopril (PRINIVIL,ZESTRIL) 10 MG tablet Take 10 mg by mouth daily.     . metFORMIN (GLUCOPHAGE) 1000 MG tablet Take 1,000 mg by mouth 2 (two) times daily with a meal.     . metroNIDAZOLE (METROCREAM) 0.75 % cream Apply 1 application topically daily.     . pioglitazone (ACTOS) 15 MG tablet Take 15 mg by mouth daily. Take 1/2 tablet daily    . ranitidine (ZANTAC) 150 MG tablet TAKE ONE TABLET BY MOUTH TWICE DAILY    . Liraglutide (VICTOZA)  18 MG/3ML SOPN Inject 18 mg into the skin daily.      No current facility-administered medications for this visit.     Review of Systems:  GENERAL:  Feels fine.  No fevers or sweats.  Weight loss of 1 pound. PERFORMANCE STATUS (ECOG):  1 HEENT:  No visual changes, runny nose, sore throat, mouth sores or tenderness. Lungs: No shortness of breath or cough.  No hemoptysis. Cardiac:  No chest pain, palpitations, orthopnea, or PND. GI:  No nausea, vomiting, diarrhea, constipation, melena or hematochezia. GU:  No urgency, frequency, dysuria, or hematuria. Musculoskeletal:  No back pain.  No joint pain.  No muscle tenderness. Extremities:  No pain or swelling. Skin:  Irritation. No rashes or skin changes. Neuro:  No headache, numbness or weakness, balance or  coordination issues. Endocrine:  Diabetes.  Thyroid disease on Synthroid.  No hot flashes or night sweats. Psych:  No mood changes, depression or anxiety. Pain:  No focal pain. Review of systems:  All other systems reviewed and found to be negative.  Physical Exam: Blood pressure (!) 171/82, pulse (!) 101, temperature 98.3 F (36.8 C), temperature source Tympanic, resp. rate 18, weight 280 lb 6 oz (127.2 kg). GENERAL:  Well developed, well nourished, heavyset woman sitting comfortably in a wheelchair in the exam room in no acute distress. MENTAL STATUS:  Alert and oriented to person, place and time. HEAD:  [place] styled short hair.  Normocephalic, atraumatic, face symmetric, no Cushingoid features. EYES:  Pupils equal round and reactive to light and accomodation.  No conjunctivitis or scleral icterus. ENT:  Oropharynx clear without lesion.  Tongue normal. Mucous membranes moist.  RESPIRATORY:  Clear to auscultation without rales, wheezes or rhonchi. CARDIOVASCULAR:  Regular rate and rhythm without murmur, rub or gallop. ABDOMEN:  Soft, non-tender, with active bowel sounds, and no hepatosplenomegaly.  No masses. BREAST:  Right breast s/p surgery and radiation.  4 cm area of scarring (no change per patient and last visit).  No new masses, skin changes or nipple discharge.  Left breast without masses, skin changes or nipple discharge.  Towel under breast. SKIN:  3 cm fungal rash.  Facial rosacea. EXTREMITIES: No edema, no skin discoloration or tenderness.  No palpable cords. LYMPH NODES: No palpable cervical, supraclavicular, axillary or inguinal adenopathy  NEUROLOGICAL: Unremarkable. PSYCH:  Appropriate.   Appointment on 06/05/2016  Component Date Value Ref Range Status  . WBC 06/05/2016 9.3  3.6 - 11.0 K/uL Final  . RBC 06/05/2016 4.46  3.80 - 5.20 MIL/uL Final  . Hemoglobin 06/05/2016 13.7  12.0 - 16.0 g/dL Final  . HCT 06/05/2016 41.5  35.0 - 47.0 % Final  . MCV 06/05/2016 92.9  80.0  - 100.0 fL Final  . MCH 06/05/2016 30.6  26.0 - 34.0 pg Final  . MCHC 06/05/2016 32.9  32.0 - 36.0 g/dL Final  . RDW 06/05/2016 14.0  11.5 - 14.5 % Final  . Platelets 06/05/2016 263  150 - 440 K/uL Final  . Neutrophils Relative % 06/05/2016 69  % Final  . Neutro Abs 06/05/2016 6.4  1.4 - 6.5 K/uL Final  . Lymphocytes Relative 06/05/2016 22  % Final  . Lymphs Abs 06/05/2016 2.1  1.0 - 3.6 K/uL Final  . Monocytes Relative 06/05/2016 6  % Final  . Monocytes Absolute 06/05/2016 0.6  0.2 - 0.9 K/uL Final  . Eosinophils Relative 06/05/2016 2  % Final  . Eosinophils Absolute 06/05/2016 0.2  0 - 0.7 K/uL Final  . Basophils  Relative 06/05/2016 1  % Final  . Basophils Absolute 06/05/2016 0.1  0 - 0.1 K/uL Final  . Sodium 06/05/2016 135  135 - 145 mmol/L Final  . Potassium 06/05/2016 5.3* 3.5 - 5.1 mmol/L Final  . Chloride 06/05/2016 99* 101 - 111 mmol/L Final  . CO2 06/05/2016 24  22 - 32 mmol/L Final  . Glucose, Bld 06/05/2016 195* 65 - 99 mg/dL Final  . BUN 06/05/2016 15  6 - 20 mg/dL Final  . Creatinine, Ser 06/05/2016 0.99  0.44 - 1.00 mg/dL Final  . Calcium 06/05/2016 9.3  8.9 - 10.3 mg/dL Final  . Total Protein 06/05/2016 7.4  6.5 - 8.1 g/dL Final  . Albumin 06/05/2016 3.7  3.5 - 5.0 g/dL Final  . AST 06/05/2016 23  15 - 41 U/L Final  . ALT 06/05/2016 20  14 - 54 U/L Final  . Alkaline Phosphatase 06/05/2016 66  38 - 126 U/L Final  . Total Bilirubin 06/05/2016 0.5  0.3 - 1.2 mg/dL Final  . GFR calc non Af Amer 06/05/2016 53* >60 mL/min Final  . GFR calc Af Amer 06/05/2016 >60  >60 mL/min Final   Comment: (NOTE) The eGFR has been calculated using the CKD EPI equation. This calculation has not been validated in all clinical situations. eGFR's persistently <60 mL/min signify possible Chronic Kidney Disease.   Georgiann Hahn gap 06/05/2016 12  5 - 15 Final    Assessment:  Vanellope Passmore is a 81 y.o. female with multi-focal right breast DCIS (1995) and stage IA Her2/neu + left breast  cancer (2015).  She was diagnosed with multifocal DCIS of the right breast in 1995.  She underwent biopsy on 02/01/1994.  Pathology revealed multi-focal DCIS with comedo necrosis.  There was no evidence of lymphatic/vascular invasion.  Microcalcifications were present.  Margins were close (less than 1 mm).  She underwent re-excision on 03/01/1994 by Dr. Siri Cole at [facility].  There was no gross evidence of malignancy.  She received radiation.  She did not receive hormonal therapy.  She was diagnosed with Her2/neu + left breast cancer in 2015.  Stereotactic core biopsy on 04/05/2014 revealed invasive mammary carcinoma of no special type (1.1 mm) in a background of DCIS.  Wide excision of the mass in the left upper outer quadrant on 04/26/2014 revealed grade II DCIS, cribriform type with focal lobular cytology.  There was no residual invasive carcinoma.  No tumor was seen in 2 lymph nodes.  Tumor was ER positive (> 90%), PR positive (90%), and Her2/neu 3+.  Pathologic stage was T1aN0(sn) M0.  She received adjuvant left whole breast radiation in 06/2014.  She started Arimidex in 07/2014.  Bilateral mammogram on 08/22/2015 revealed no evidence of malignancy.  CA27.29 was 19.2 on 02/07/2016.  Bone density study on 06/27/2015 revealed osteoporosis with a T score of -3.2 in the left femoral neck, -2.5 in the hip and -2.4 in the left forearm.  She was previously on Fosamax, but discontinued for an unspecified period of time.  She restarted Fosamax in 06/2015.  She is on calcium and vitamin D.  Symptomatically, she denies any breast concerns. She has [place] coat hypertension.  She has a Candidal rash.  Potassium is elevated (5.3).  Plan: 1.  Labs today:  CBC with diff, CMP, CA27.29. 2.  Recheck potassium. 3.  Continue Arimidex. 4.  Discuss osteoporosis and continuation of Fosamax, calcium, and vitamin D.  5.  Mammogram on 08/21/2016. 6.  Rx:  Nystatin powder.  Contact  clinic if no resolution. 7.  RTC in  4 months for MD assessment and labs (CBC with diff, CMP, CA27.29).   Lequita Asal, MD  06/05/2016, 11:11 AM

## 2016-06-05 ENCOUNTER — Inpatient Hospital Stay: Payer: Medicare Other | Attending: Hematology and Oncology

## 2016-06-05 ENCOUNTER — Encounter: Payer: Self-pay | Admitting: Hematology and Oncology

## 2016-06-05 ENCOUNTER — Inpatient Hospital Stay (HOSPITAL_BASED_OUTPATIENT_CLINIC_OR_DEPARTMENT_OTHER): Payer: Medicare Other | Admitting: Hematology and Oncology

## 2016-06-05 ENCOUNTER — Telehealth: Payer: Self-pay | Admitting: *Deleted

## 2016-06-05 VITALS — BP 171/90 | HR 99 | Temp 98.3°F | Resp 18 | Wt 280.4 lb

## 2016-06-05 DIAGNOSIS — Z853 Personal history of malignant neoplasm of breast: Secondary | ICD-10-CM | POA: Diagnosis not present

## 2016-06-05 DIAGNOSIS — Z79811 Long term (current) use of aromatase inhibitors: Secondary | ICD-10-CM

## 2016-06-05 DIAGNOSIS — B379 Candidiasis, unspecified: Secondary | ICD-10-CM

## 2016-06-05 DIAGNOSIS — E875 Hyperkalemia: Secondary | ICD-10-CM | POA: Diagnosis not present

## 2016-06-05 DIAGNOSIS — E079 Disorder of thyroid, unspecified: Secondary | ICD-10-CM

## 2016-06-05 DIAGNOSIS — C50412 Malignant neoplasm of upper-outer quadrant of left female breast: Secondary | ICD-10-CM

## 2016-06-05 DIAGNOSIS — Z8042 Family history of malignant neoplasm of prostate: Secondary | ICD-10-CM | POA: Diagnosis not present

## 2016-06-05 DIAGNOSIS — Z79899 Other long term (current) drug therapy: Secondary | ICD-10-CM

## 2016-06-05 DIAGNOSIS — Z8051 Family history of malignant neoplasm of kidney: Secondary | ICD-10-CM

## 2016-06-05 DIAGNOSIS — K219 Gastro-esophageal reflux disease without esophagitis: Secondary | ICD-10-CM

## 2016-06-05 DIAGNOSIS — Z794 Long term (current) use of insulin: Secondary | ICD-10-CM

## 2016-06-05 DIAGNOSIS — M818 Other osteoporosis without current pathological fracture: Secondary | ICD-10-CM

## 2016-06-05 DIAGNOSIS — Z170 Estrogen receptor positive status [ER+]: Secondary | ICD-10-CM | POA: Insufficient documentation

## 2016-06-05 DIAGNOSIS — E119 Type 2 diabetes mellitus without complications: Secondary | ICD-10-CM | POA: Insufficient documentation

## 2016-06-05 DIAGNOSIS — Z800 Family history of malignant neoplasm of digestive organs: Secondary | ICD-10-CM | POA: Diagnosis not present

## 2016-06-05 DIAGNOSIS — M810 Age-related osteoporosis without current pathological fracture: Secondary | ICD-10-CM

## 2016-06-05 DIAGNOSIS — Z923 Personal history of irradiation: Secondary | ICD-10-CM | POA: Insufficient documentation

## 2016-06-05 DIAGNOSIS — D0511 Intraductal carcinoma in situ of right breast: Secondary | ICD-10-CM

## 2016-06-05 LAB — COMPREHENSIVE METABOLIC PANEL
ALT: 20 U/L (ref 14–54)
AST: 23 U/L (ref 15–41)
Albumin: 3.7 g/dL (ref 3.5–5.0)
Alkaline Phosphatase: 66 U/L (ref 38–126)
Anion gap: 12 (ref 5–15)
BUN: 15 mg/dL (ref 6–20)
CO2: 24 mmol/L (ref 22–32)
Calcium: 9.3 mg/dL (ref 8.9–10.3)
Chloride: 99 mmol/L — ABNORMAL LOW (ref 101–111)
Creatinine, Ser: 0.99 mg/dL (ref 0.44–1.00)
GFR calc Af Amer: >60 mL/min (ref >60)
GFR calc non Af Amer: 53 mL/min — ABNORMAL LOW (ref >60)
Glucose, Bld: 195 mg/dL — ABNORMAL HIGH (ref 65–99)
Potassium: 5.3 mmol/L — ABNORMAL HIGH (ref 3.5–5.1)
Sodium: 135 mmol/L (ref 135–145)
Total Bilirubin: 0.5 mg/dL (ref 0.3–1.2)
Total Protein: 7.4 g/dL (ref 6.5–8.1)

## 2016-06-05 LAB — POTASSIUM: Potassium: 5.2 mmol/L — ABNORMAL HIGH (ref 3.5–5.1)

## 2016-06-05 LAB — CBC WITH DIFFERENTIAL/PLATELET
Basophils Absolute: 0.1 10*3/uL (ref 0–0.1)
Basophils Relative: 1 %
Eosinophils Absolute: 0.2 10*3/uL (ref 0–0.7)
Eosinophils Relative: 2 %
HCT: 41.5 % (ref 35.0–47.0)
Hemoglobin: 13.7 g/dL (ref 12.0–16.0)
Lymphocytes Relative: 22 %
Lymphs Abs: 2.1 10*3/uL (ref 1.0–3.6)
MCH: 30.6 pg (ref 26.0–34.0)
MCHC: 32.9 g/dL (ref 32.0–36.0)
MCV: 92.9 fL (ref 80.0–100.0)
Monocytes Absolute: 0.6 10*3/uL (ref 0.2–0.9)
Monocytes Relative: 6 %
Neutro Abs: 6.4 10*3/uL (ref 1.4–6.5)
Neutrophils Relative %: 69 %
Platelets: 263 10*3/uL (ref 150–440)
RBC: 4.46 MIL/uL (ref 3.80–5.20)
RDW: 14 % (ref 11.5–14.5)
WBC: 9.3 10*3/uL (ref 3.6–11.0)

## 2016-06-05 MED ORDER — NYSTATIN 100000 UNIT/GM EX POWD: Freq: Four times a day (QID) | CUTANEOUS | 2 refills | Status: DC

## 2016-06-05 NOTE — Progress Notes (Signed)
No new diagnosis since last visit. 

## 2016-06-05 NOTE — Telephone Encounter (Signed)
Patient noted with increased potassium. Danielle Bennett at Dr. Ellison Bennett office notified, Danielle Bennett reports that they will make Danielle Bennett an appointment to recheck her labs and follow up.  Danielle Bennett notified of above conversations and that [facility] will call with an appointment

## 2016-06-06 LAB — CANCER ANTIGEN 27.29: CA 27.29: 16.4 U/mL (ref 0.0–38.6)

## 2016-08-22 ENCOUNTER — Ambulatory Visit
Admission: RE | Admit: 2016-08-22 | Discharge: 2016-08-22 | Disposition: A | Discharge status: Home / Self Care | Payer: Medicare Other | Source: Ambulatory Visit | Attending: Hematology and Oncology | Admitting: Hematology and Oncology

## 2016-08-22 ENCOUNTER — Other Ambulatory Visit: Payer: Self-pay | Admitting: Hematology and Oncology

## 2016-08-22 DIAGNOSIS — C50412 Malignant neoplasm of upper-outer quadrant of left female breast: Secondary | ICD-10-CM

## 2016-08-22 DIAGNOSIS — Z170 Estrogen receptor positive status [ER+]: Principal | ICD-10-CM

## 2016-08-22 DIAGNOSIS — Z853 Personal history of malignant neoplasm of breast: Secondary | ICD-10-CM | POA: Insufficient documentation

## 2016-08-22 DIAGNOSIS — Z09 Encounter for follow-up examination after completed treatment for conditions other than malignant neoplasm: Secondary | ICD-10-CM | POA: Insufficient documentation

## 2016-08-23 ENCOUNTER — Encounter: Payer: Self-pay | Admitting: Hematology and Oncology

## 2016-10-09 ENCOUNTER — Inpatient Hospital Stay: Payer: Medicare Other | Attending: Hematology and Oncology

## 2016-10-09 ENCOUNTER — Inpatient Hospital Stay (HOSPITAL_BASED_OUTPATIENT_CLINIC_OR_DEPARTMENT_OTHER): Payer: Medicare Other | Admitting: Hematology and Oncology

## 2016-10-09 ENCOUNTER — Encounter: Payer: Self-pay | Admitting: Hematology and Oncology

## 2016-10-09 VITALS — BP 147/77 | HR 84 | Temp 98.6°F | Resp 18 | Wt 273.0 lb

## 2016-10-09 DIAGNOSIS — Z86000 Personal history of in-situ neoplasm of breast: Secondary | ICD-10-CM | POA: Diagnosis not present

## 2016-10-09 DIAGNOSIS — C50412 Malignant neoplasm of upper-outer quadrant of left female breast: Secondary | ICD-10-CM

## 2016-10-09 DIAGNOSIS — Z170 Estrogen receptor positive status [ER+]: Secondary | ICD-10-CM | POA: Insufficient documentation

## 2016-10-09 DIAGNOSIS — B3789 Other sites of candidiasis: Secondary | ICD-10-CM

## 2016-10-09 DIAGNOSIS — Z803 Family history of malignant neoplasm of breast: Secondary | ICD-10-CM | POA: Insufficient documentation

## 2016-10-09 DIAGNOSIS — Z79811 Long term (current) use of aromatase inhibitors: Secondary | ICD-10-CM | POA: Insufficient documentation

## 2016-10-09 DIAGNOSIS — M810 Age-related osteoporosis without current pathological fracture: Secondary | ICD-10-CM

## 2016-10-09 DIAGNOSIS — D0511 Intraductal carcinoma in situ of right breast: Secondary | ICD-10-CM

## 2016-10-09 DIAGNOSIS — E875 Hyperkalemia: Secondary | ICD-10-CM

## 2016-10-09 LAB — CBC WITH DIFFERENTIAL/PLATELET
Basophils Absolute: 0.1 10*3/uL (ref 0–0.1)
Basophils Relative: 1 %
Eosinophils Absolute: 0.2 10*3/uL (ref 0–0.7)
Eosinophils Relative: 2 %
HCT: 40.9 % (ref 35.0–47.0)
Hemoglobin: 13.8 g/dL (ref 12.0–16.0)
Lymphocytes Relative: 21 %
Lymphs Abs: 1.7 10*3/uL (ref 1.0–3.6)
MCH: 30.8 pg (ref 26.0–34.0)
MCHC: 33.8 g/dL (ref 32.0–36.0)
MCV: 91.3 fL (ref 80.0–100.0)
Monocytes Absolute: 0.5 10*3/uL (ref 0.2–0.9)
Monocytes Relative: 6 %
Neutro Abs: 5.6 10*3/uL (ref 1.4–6.5)
Neutrophils Relative %: 70 %
Platelets: 292 10*3/uL (ref 150–440)
RBC: 4.48 MIL/uL (ref 3.80–5.20)
RDW: 14.2 % (ref 11.5–14.5)
WBC: 8 10*3/uL (ref 3.6–11.0)

## 2016-10-09 LAB — COMPREHENSIVE METABOLIC PANEL
ALT: 26 U/L (ref 14–54)
AST: 30 U/L (ref 15–41)
Albumin: 3.5 g/dL (ref 3.5–5.0)
Alkaline Phosphatase: 69 U/L (ref 38–126)
Anion gap: 11 (ref 5–15)
BUN: 15 mg/dL (ref 6–20)
CO2: 25 mmol/L (ref 22–32)
Calcium: 9.4 mg/dL (ref 8.9–10.3)
Chloride: 99 mmol/L — ABNORMAL LOW (ref 101–111)
Creatinine, Ser: 0.75 mg/dL (ref 0.44–1.00)
GFR calc Af Amer: >60 mL/min (ref >60)
GFR calc non Af Amer: >60 mL/min (ref >60)
Glucose, Bld: 145 mg/dL — ABNORMAL HIGH (ref 65–99)
Potassium: 4.1 mmol/L (ref 3.5–5.1)
Sodium: 135 mmol/L (ref 135–145)
Total Bilirubin: 0.7 mg/dL (ref 0.3–1.2)
Total Protein: 7.1 g/dL (ref 6.5–8.1)

## 2016-10-09 NOTE — Progress Notes (Signed)
[facility] day:  10/09/16  Chief Complaint: Danielle Bennett is a 82 y.o. female with a history of multi-focal right breast DCIS (1995) and stage IA Her2/neu + left breast cancer (2015) who is seen for 4 month assessment.  HPI:  The patient was last seen in the medical oncology clinic on 06/05/2016.  At that time, she denied any breast concerns. She had Rom[place] coat hypertension.  Exam revealed a Candidal rash.  Potassium was elevated (5.3).  She continued Arimidex.  She was on calcium, vitamin D, and Fosamax for osteoporosis.  Bilateral diagnostic mammogram on 08/22/2016 revealed no evidence of malignancy.  During the interim, she has felt "fine".  She denies any breast concerns.  She denies any bone pain.  She takes Fosamax on Mondays which causes diarrhea.   Past Medical History:  Diagnosis Date  . Breast cancer ([place]) 1995   right breast cancer  . Breast cancer ([place]) 2015   left breast cancer  . Diabetes mellitus without complication ([place])   . GERD (gastroesophageal reflux disease)   . Thyroid disease     Past Surgical History:  Procedure Laterality Date  . BREAST LUMPECTOMY Right 1995  . BREAST LUMPECTOMY Left 04/2014    Family History  Problem Relation Age of Onset  . Heart attack Mother   . Kidney cancer Brother   . Prostate cancer Brother   . Esophageal cancer Brother   . Breast cancer Neg Hx     Social History:  reports that she has never smoked. She has never used smokeless tobacco. She reports that she does not drink alcohol or use drugs.  She lives [place]. 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  She has family members who live on either side of her.  The patient is alone today.  Allergies:  Allergies  Allergen Reactions  . Simvastatin     Other reaction(s): Other (See Comments) Myalgias    Current Medications: Current Outpatient Prescriptions  Medication Sig  Dispense Refill  . alendronate (FOSAMAX) 70 MG tablet Take 70 mg by mouth once a week. Take with a full glass of water on an empty stomach.    Marland Kitchen anastrozole (ARIMIDEX) 1 MG tablet Take 1 tablet (1 mg total) by mouth daily. 90 tablet 3  . atorvastatin (LIPITOR) 40 MG tablet TAKE ONE TABLET BY MOUTH ONCE DAILY AT NIGHT TIME.    . betamethasone valerate (VALISONE) 0.1 % cream Apply to skin as directed prn for itch    . furosemide (LASIX) 20 MG tablet Take 20 mg by mouth daily as needed.     Marland Kitchen glipiZIDE (GLUCOTROL XL) 10 MG 24 hr tablet TAKE 1 TABLET (10 MG TOTAL) BY MOUTH 2 (TWO) TIMES DAILY.    Marland Kitchen insulin lispro (HUMALOG) 100 UNIT/ML KiwkPen Use 2-7 units before each meal as directed    . levothyroxine (SYNTHROID, LEVOTHROID) 137 MCG tablet Take 137 mcg by mouth daily.     Marland Kitchen lisinopril (PRINIVIL,ZESTRIL) 10 MG tablet Take 10 mg by mouth daily.     . metFORMIN (GLUCOPHAGE) 1000 MG tablet Take 1,000 mg by mouth 2 (two) times daily with a meal.     . metroNIDAZOLE (METROCREAM) 0.75 % cream Apply 1 application topically daily.     Marland Kitchen nystatin (MYCOSTATIN/NYSTOP) powder Apply topically 4 (four) times daily. 15 g 2  . pioglitazone (ACTOS) 15 MG tablet Take 15 mg by mouth daily. Take 1/2 tablet  daily    . ranitidine (ZANTAC) 150 MG tablet TAKE ONE TABLET BY MOUTH TWICE DAILY    . Liraglutide (VICTOZA) 18 MG/3ML SOPN Inject 18 mg into the skin daily.      No current facility-administered medications for this visit.     Review of Systems:  GENERAL:  Feels fine.  No fevers or sweats.  Weight loss of 7 pounds. PERFORMANCE STATUS (ECOG):  1 HEENT:  No visual changes, runny nose, sore throat, mouth sores or tenderness. Lungs: No shortness of breath or cough.  No hemoptysis. Cardiac:  No chest pain, palpitations, orthopnea, or PND. GI:  Fosamax causes diarrhea.  No nausea, vomiting, constipation, melena or hematochezia. GU:  No urgency, frequency, dysuria, or hematuria. Musculoskeletal:  No back pain.  No  joint pain.  No muscle tenderness. Extremities:  No pain or swelling. Skin:  No rashes or skin changes. Neuro:  No headache, numbness or weakness, balance or coordination issues. Endocrine:  Diabetes.  Thyroid disease on Synthroid.  No hot flashes or night sweats. Psych:  No mood changes, depression or anxiety. Pain:  No focal pain. Review of systems:  All other systems reviewed and found to be negative.  Physical Exam: Blood pressure (!) 147/77, pulse 84, temperature 98.6 F (37 C), temperature source Tympanic, resp. rate 18, weight 273 lb (123.8 kg). GENERAL:  Well developed, well nourished, heavyset woman sitting comfortably in a wheelchair in the exam room in no acute distress.  She has a cane at her side. MENTAL STATUS:  Alert and oriented to person, place and time. HEAD:  [place] styled short hair.  Normocephalic, atraumatic, face symmetric, no Cushingoid features. EYES:  Pupils equal round and reactive to light and accomodation.  No conjunctivitis or scleral icterus. ENT:  Oropharynx clear without lesion.  Tongue normal. Mucous membranes moist.  RESPIRATORY:  Clear to auscultation without rales, wheezes or rhonchi. CARDIOVASCULAR:  Regular rate and rhythm without murmur, rub or gallop. ABDOMEN:  Soft, non-tender, with active bowel sounds, and no hepatosplenomegaly.  No masses. BREAST:  Right breast s/p surgery and radiation.  3.5 cm area of scarring at the 9 o'clock position (stable).  No new masses, skin changes or nipple discharge.  Left breast without masses, skin changes or nipple discharge. Moist under breasts (right > left). SKIN:  Facial rosacea.  Moist under breast, no fungal rash. EXTREMITIES: No edema, no skin discoloration or tenderness.  No palpable cords. LYMPH NODES: No palpable cervical, supraclavicular, axillary or inguinal adenopathy  NEUROLOGICAL: Unremarkable. PSYCH:  Appropriate.   Appointment on 10/09/2016  Component Date Value Ref Range Status  . Sodium  10/09/2016 135  135 - 145 mmol/L Final  . Potassium 10/09/2016 4.1  3.5 - 5.1 mmol/L Final  . Chloride 10/09/2016 99* 101 - 111 mmol/L Final  . CO2 10/09/2016 25  22 - 32 mmol/L Final  . Glucose, Bld 10/09/2016 145* 65 - 99 mg/dL Final  . BUN 10/09/2016 15  6 - 20 mg/dL Final  . Creatinine, Ser 10/09/2016 0.75  0.44 - 1.00 mg/dL Final  . Calcium 10/09/2016 9.4  8.9 - 10.3 mg/dL Final  . Total Protein 10/09/2016 7.1  6.5 - 8.1 g/dL Final  . Albumin 10/09/2016 3.5  3.5 - 5.0 g/dL Final  . AST 10/09/2016 30  15 - 41 U/L Final  . ALT 10/09/2016 26  14 - 54 U/L Final  . Alkaline Phosphatase 10/09/2016 69  38 - 126 U/L Final  . Total Bilirubin 10/09/2016 0.7  0.3 -  1.2 mg/dL Final  . GFR calc non Af Amer 10/09/2016 >60  >60 mL/min Final  . GFR calc Af Amer 10/09/2016 >60  >60 mL/min Final   Comment: (NOTE) The eGFR has been calculated using the CKD EPI equation. This calculation has not been validated in all clinical situations. eGFR's persistently <60 mL/min signify possible Chronic Kidney Disease.   . Anion gap 10/09/2016 11  5 - 15 Final  . WBC 10/09/2016 8.0  3.6 - 11.0 K/uL Final  . RBC 10/09/2016 4.48  3.80 - 5.20 MIL/uL Final  . Hemoglobin 10/09/2016 13.8  12.0 - 16.0 g/dL Final  . HCT 10/09/2016 40.9  35.0 - 47.0 % Final  . MCV 10/09/2016 91.3  80.0 - 100.0 fL Final  . MCH 10/09/2016 30.8  26.0 - 34.0 pg Final  . MCHC 10/09/2016 33.8  32.0 - 36.0 g/dL Final  . RDW 10/09/2016 14.2  11.5 - 14.5 % Final  . Platelets 10/09/2016 292  150 - 440 K/uL Final  . Neutrophils Relative % 10/09/2016 70  % Final  . Neutro Abs 10/09/2016 5.6  1.4 - 6.5 K/uL Final  . Lymphocytes Relative 10/09/2016 21  % Final  . Lymphs Abs 10/09/2016 1.7  1.0 - 3.6 K/uL Final  . Monocytes Relative 10/09/2016 6  % Final  . Monocytes Absolute 10/09/2016 0.5  0.2 - 0.9 K/uL Final  . Eosinophils Relative 10/09/2016 2  % Final  . Eosinophils Absolute 10/09/2016 0.2  0 - 0.7 K/uL Final  . Basophils Relative  10/09/2016 1  % Final  . Basophils Absolute 10/09/2016 0.1  0 - 0.1 K/uL Final    Assessment:  Analiya Porco is a 82 y.o. female with multi-focal right breast DCIS (1995) and stage IA Her2/neu + left breast cancer (2015).  She was diagnosed with multifocal DCIS of the right breast in 1995.  She underwent biopsy on 02/01/1994.  Pathology revealed multi-focal DCIS with comedo necrosis.  There was no evidence of lymphatic/vascular invasion.  Microcalcifications were present.  Margins were close (less than 1 mm).  She underwent re-excision on 03/01/1994 by Dr. Siri Cole at [facility].  There was no gross evidence of malignancy.  She received radiation.  She did not receive hormonal therapy.  She was diagnosed with Her2/neu + left breast cancer in 2015.  Stereotactic core biopsy on 04/05/2014 revealed invasive mammary carcinoma of no special type (1.1 mm) in a background of DCIS.  Wide excision of the mass in the left upper outer quadrant on 04/26/2014 revealed grade II DCIS, cribriform type with focal lobular cytology.  There was no residual invasive carcinoma.  No tumor was seen in 2 lymph nodes.  Tumor was ER positive (> 90%), PR positive (90%), and Her2/neu 3+.  Pathologic stage was T1aN0(sn) M0.  She received adjuvant left whole breast radiation in 06/2014.  She started Arimidex in 07/2014.  Bilateral mammogram on 08/22/2016 revealed no evidence of malignancy.    CA27.29 has been followed: 19.2 on 02/07/2016, 16.4 on 06/05/2016, and 18.1 on 10/09/2016.  Bone density study on 06/27/2015 revealed osteoporosis with a T score of -3.2 in the left femoral neck, -2.5 in the hip and -2.4 in the left forearm.  She was previously on Fosamax, but discontinued for an unspecified period of time.  She restarted Fosamax in 06/2015.  She is on calcium and vitamin D.  Symptomatically, she denies any breast concerns. She has [place] coat hypertension.  Potassium is normal.  Plan: 1.  Labs today:  CBC with  diff, CMP, CA27.29. 2.  Review mammogram on 08/22/2016.  No evidence of malignancy. 3.  Continue Arimidex. 4.  Continue Fosamax, calcium, and vitamin D.  5.  Nystatin powder prn.  Contact clinic if refills needed. 6.  RTC in 6 months for MD assessment and labs (CBC with diff, CMP, CA27.29).   Lequita Asal, MD  10/09/2016, 11:28 AM

## 2016-10-09 NOTE — Progress Notes (Signed)
Patient offers no concerns today.  Patient states she had her mammogram in February and it was normal.

## 2016-10-10 LAB — CANCER ANTIGEN 27.29: CA 27.29: 18.1 U/mL (ref 0.0–38.6)

## 2017-04-06 ENCOUNTER — Other Ambulatory Visit: Payer: Self-pay | Admitting: Hematology and Oncology

## 2017-04-06 DIAGNOSIS — C50412 Malignant neoplasm of upper-outer quadrant of left female breast: Secondary | ICD-10-CM

## 2017-04-06 DIAGNOSIS — Z170 Estrogen receptor positive status [ER+]: Principal | ICD-10-CM

## 2017-04-08 NOTE — Progress Notes (Signed)
[facility]-  Cancer Center  Clinic day:  04/09/17  Chief Complaint: Danielle Bennett is a 82 y.o. female with a history of multi-focal right breast DCIS (1995) and stage IA Her2/neu + left breast cancer (2015) who is seen for 6 month assessment.   HPI:  The patient was last seen in the medical oncology clinic on 10/09/2016.  At that time, she denied any breast concerns. She did not have any bone pain. Patient noted that she took her Fosamax on Mondays and that it caused her to have some diarrhea. Patient continued on Arimidex. CBC and CMP were normal. CA27.29 was 18.1.   During the interim, patient is doing "just fine".  She continues on the prescribed Fosamax, which still is causing brief periods of diarrhea. She is taking her Arimidex as prescribed. Patient denies any breast concerns. She has had no B symptoms or interval infections. Patient's diabetes is under better control. Patient with intertriginous rash beneath her breasts bilaterally. She notes that her last HgbA1c was down in the 7s. Patient is eating well, with no significant weight loss.    Past Medical History:  Diagnosis Date  . Breast cancer (HCC) 1995   right breast cancer  . Breast cancer (HCC) 2015   left breast cancer  . Diabetes mellitus without complication (HCC)   . GERD (gastroesophageal reflux disease)   . Thyroid disease     Past Surgical History:  Procedure Laterality Date  . BREAST LUMPECTOMY Right 1995  . BREAST LUMPECTOMY Left 04/2014    Family History  Problem Relation Age of Onset  . Heart attack Mother   . Kidney cancer Brother   . Prostate cancer Brother   . Esophageal cancer Brother   . Breast cancer Neg Hx     Social History:  reports that she has never smoked. She has never used smokeless tobacco. She reports that she does not drink alcohol or use drugs.  She lives [place] [address]. She is a retired Careers adviser. She lives alone.  She has  family members who live on either side of her.  The patient is alone today.  Allergies:  Allergies  Allergen Reactions  . Simvastatin     Other reaction(s): Other (See Comments) Myalgias    Current Medications: Current Outpatient Prescriptions  Medication Sig Dispense Refill  . alendronate (FOSAMAX) 70 MG tablet Take 70 mg by mouth once a week. Take with a full glass of water on an empty stomach.    Marland Kitchen anastrozole (ARIMIDEX) 1 MG tablet TAKE ONE TABLET BY MOUTH ONCE DAILY 90 tablet 3  . atorvastatin (LIPITOR) 40 MG tablet TAKE ONE TABLET BY MOUTH ONCE DAILY AT NIGHT TIME - MWF. and also takes a half tablet all other days.    . betamethasone valerate (VALISONE) 0.1 % cream Apply to skin as directed prn for itch    . Calcium Carb-Cholecalciferol (CALTRATE 600+D) 600-800 MG-UNIT TABS Take 2 tablets by mouth 2 (two) times daily.    . furosemide (LASIX) 20 MG tablet Take 20 mg by mouth daily as needed for edema.     Marland Kitchen glipiZIDE (GLUCOTROL XL) 10 MG 24 hr tablet TAKE 1 TABLET (10 MG TOTAL) BY MOUTH 2 (TWO) TIMES DAILY.    Marland Kitchen glucose blood test strip USE 3 (THREE) TIMES DAILY.    Marland Kitchen insulin lispro (HUMALOG) 100 UNIT/ML KiwkPen Use 2-7 units before each meal as directed    . Insulin Pen Needle (B-D ULTRAFINE III  SHORT PEN) 31G X 8 MM MISC USE 1  THREE TIMES DAILY AS DIRECTED    . levothyroxine (SYNTHROID, LEVOTHROID) 137 MCG tablet Take 137 mcg by mouth daily.     . Liraglutide (VICTOZA) 18 MG/3ML SOPN Inject 12 mg into the skin daily.     Marland Kitchen lisinopril (PRINIVIL,ZESTRIL) 10 MG tablet Take 10 mg by mouth daily.     . metFORMIN (GLUCOPHAGE) 1000 MG tablet Take 1,000 mg by mouth 2 (two) times daily with a meal.     . metroNIDAZOLE (METROCREAM) 0.75 % cream Apply 1 application topically daily.     Marland Kitchen nystatin (MYCOSTATIN/NYSTOP) powder Apply topically 4 (four) times daily. 15 g 2  . pioglitazone (ACTOS) 15 MG tablet Take 7.5 mg by mouth every other day.     . ranitidine (ZANTAC) 150 MG tablet TAKE ONE  TABLET BY MOUTH TWICE DAILY     No current facility-administered medications for this visit.     Review of Systems:  GENERAL:  Feels fine.  No fevers or sweats.  Weight down 1 pound.  PERFORMANCE STATUS (ECOG):  1 HEENT:  No visual changes, runny nose, sore throat, mouth sores or tenderness. Lungs: No shortness of breath or cough.  No hemoptysis. Cardiac:  No chest pain, palpitations, orthopnea, or PND. GI:  Fosamax causes diarrhea.  No nausea, vomiting, constipation, melena or hematochezia. GU:  No urgency, frequency, dysuria, or hematuria. Musculoskeletal:  No back pain.  No joint pain.  No muscle tenderness. Extremities:  No pain or swelling. Skin:  No rashes or skin changes. Neuro:  No headache, numbness or weakness, balance or coordination issues. Endocrine:  Diabetes.  Thyroid disease on Synthroid.  No hot flashes or night sweats. Psych:  No mood changes, depression or anxiety. Pain:  No focal pain. Review of systems:  All other systems reviewed and found to be negative.  Physical Exam: Blood pressure 139/81, pulse (!) 118, temperature 98.6 F (37 C), temperature source Tympanic, resp. rate (!) 22, height '5\' 4"'$  (1.626 m), weight 272 lb (123.4 kg). GENERAL:  Well developed, well nourished, heavyset woman sitting comfortably in a wheelchair in the exam room in no acute distress.  She has a cane at her side. MENTAL STATUS:  Alert and oriented to person, place and time. HEAD:  Furry styled short hair.  Normocephalic, atraumatic, face symmetric, no Cushingoid features. EYES:  Pupils equal round and reactive to light and accomodation.  No conjunctivitis or scleral icterus. ENT:  Oropharynx clear without lesion.  Tongue normal. Mucous membranes moist.  RESPIRATORY:  Clear to auscultation without rales, wheezes or rhonchi. CARDIOVASCULAR:  Regular rate and rhythm without murmur, rub or gallop. ABDOMEN:  Soft, non-tender, with active bowel sounds, and no hepatosplenomegaly.  No  masses. BREAST:  Right breast s/p surgery and radiation.  3.5 x 3 cm area of scarring at the 9 o'clock position (stable).  No new masses, skin changes or nipple discharge.  Left breast without masses, skin changes or nipple discharge. Moist under breasts (right > left). SKIN:  Facial rosacea.  Moist under breast, no fungal rash. EXTREMITIES: No edema, no skin discoloration or tenderness.  No palpable cords. LYMPH NODES: No palpable cervical, supraclavicular, axillary or inguinal adenopathy  NEUROLOGICAL: Unremarkable. PSYCH:  Appropriate.   Office Visit on 04/09/2017  Component Date Value Ref Range Status  . WBC 04/09/2017 8.4  3.6 - 11.0 K/uL Final  . RBC 04/09/2017 4.37  3.80 - 5.20 MIL/uL Final  . Hemoglobin 04/09/2017 13.7  12.0 -  16.0 g/dL Final  . HCT 04/09/2017 40.6  35.0 - 47.0 % Final  . MCV 04/09/2017 92.8  80.0 - 100.0 fL Final  . MCH 04/09/2017 31.4  26.0 - 34.0 pg Final  . MCHC 04/09/2017 33.9  32.0 - 36.0 g/dL Final  . RDW 04/09/2017 14.4  11.5 - 14.5 % Final  . Platelets 04/09/2017 283  150 - 440 K/uL Final  . Neutrophils Relative % 04/09/2017 73  % Final  . Neutro Abs 04/09/2017 6.2  1.4 - 6.5 K/uL Final  . Lymphocytes Relative 04/09/2017 19  % Final  . Lymphs Abs 04/09/2017 1.6  1.0 - 3.6 K/uL Final  . Monocytes Relative 04/09/2017 6  % Final  . Monocytes Absolute 04/09/2017 0.5  0.2 - 0.9 K/uL Final  . Eosinophils Relative 04/09/2017 1  % Final  . Eosinophils Absolute 04/09/2017 0.1  0 - 0.7 K/uL Final  . Basophils Relative 04/09/2017 1  % Final  . Basophils Absolute 04/09/2017 0.1  0 - 0.1 K/uL Final  . Sodium 04/09/2017 136  135 - 145 mmol/L Final  . Potassium 04/09/2017 4.2  3.5 - 5.1 mmol/L Final  . Chloride 04/09/2017 101  101 - 111 mmol/L Final  . CO2 04/09/2017 25  22 - 32 mmol/L Final  . Glucose, Bld 04/09/2017 237* 65 - 99 mg/dL Final  . BUN 04/09/2017 14  6 - 20 mg/dL Final  . Creatinine, Ser 04/09/2017 0.96  0.44 - 1.00 mg/dL Final  . Calcium  04/09/2017 9.0  8.9 - 10.3 mg/dL Final  . Total Protein 04/09/2017 7.3  6.5 - 8.1 g/dL Final  . Albumin 04/09/2017 3.7  3.5 - 5.0 g/dL Final  . AST 04/09/2017 22  15 - 41 U/L Final  . ALT 04/09/2017 13* 14 - 54 U/L Final  . Alkaline Phosphatase 04/09/2017 68  38 - 126 U/L Final  . Total Bilirubin 04/09/2017 0.7  0.3 - 1.2 mg/dL Final  . GFR calc non Af Amer 04/09/2017 54* >60 mL/min Final  . GFR calc Af Amer 04/09/2017 >60  >60 mL/min Final   Comment: (NOTE) The eGFR has been calculated using the CKD EPI equation. This calculation has not been validated in all clinical situations. eGFR's persistently <60 mL/min signify possible Chronic Kidney Disease.   . Anion gap 04/09/2017 10  5 - 15 Final    Assessment:  Danielle Bennett is a 82 y.o. female with multi-focal right breast DCIS (1995) and stage IA Her2/neu + left breast cancer (2015).  She was diagnosed with multifocal DCIS of the right breast in 1995.  She underwent biopsy on 02/01/1994.  Pathology revealed multi-focal DCIS with comedo necrosis.  There was no evidence of lymphatic/vascular invasion.  Microcalcifications were present.  Margins were close (less than 1 mm).  She underwent re-excision on 03/01/1994 by Dr. Siri Cole at [facility].  There was no gross evidence of malignancy.  She received radiation.  She did not receive hormonal therapy.  She was diagnosed with Her2/neu + left breast cancer in 2015.  Stereotactic core biopsy on 04/05/2014 revealed invasive mammary carcinoma of no special type (1.1 mm) in a background of DCIS.  Wide excision of the mass in the left upper outer quadrant on 04/26/2014 revealed grade II DCIS, cribriform type with focal lobular cytology.  There was no residual invasive carcinoma.  No tumor was seen in 2 lymph nodes.  Tumor was ER positive (> 90%), PR positive (90%), and Her2/neu 3+.  Pathologic stage was T1aN0(sn) M0.  She received adjuvant left whole breast radiation in 06/2014.  She started  Arimidex in 07/2014.  Bilateral mammogram on 08/22/2016 revealed no evidence of malignancy.    CA27.29 has been followed: 19.2 on 02/07/2016, 16.4 on 06/05/2016, 18.1 on 10/09/2016, and 16.2 on 04/09/2017.  Bone density study on 06/27/2015 revealed osteoporosis with a T score of -3.2 in the left femoral neck, -2.5 in the hip and -2.4 in the left forearm.  She was previously on Fosamax, but discontinued for an unspecified period of time.  She restarted Fosamax in 06/2015.  She is on calcium and vitamin D.  Symptomatically,  she denies any breast concerns.  Exam reveals intertriginous moisture related skin changes beneath her breasts. Labs unremarkable.  Plan: 1.  Labs today:  CBC with diff, CMP, CA27.29 2.  Continue Arimidex as previously prescribed. 3.  Continue Fosamax, calcium, and vitamin D as previously prescribed. 4.  Rx: Nystatin power topically 4 times a day PRN.  5.  Schedule DXA on 06/26/2017  5.  Schedule mammogram on 08/22/2017. 6.  Discussed influenza vaccination. Patient with adequate blood counts. Will proceed with vaccination today.  7.  RTC in 6 months for MD assessment and labs (CBC with diff, CMP, CA27.29). Clinic appointment to be after mammogram.    Honor Loh, NP  04/09/2017, 11:30 AM   I saw and evaluated the patient, participating in the key portions of the service and reviewing pertinent diagnostic studies and records.  I reviewed the nurse practitioner's note and agree with the findings and the plan.  The assessment and plan were discussed with the patient.  A few questions were asked by the patient and answered.   Nolon Stalls, MD 04/09/2017, 11:30 AM

## 2017-04-09 ENCOUNTER — Encounter: Payer: Self-pay | Admitting: Hematology and Oncology

## 2017-04-09 ENCOUNTER — Inpatient Hospital Stay: Payer: Medicare Other | Attending: Hematology and Oncology | Admitting: Hematology and Oncology

## 2017-04-09 ENCOUNTER — Inpatient Hospital Stay: Payer: Medicare Other

## 2017-04-09 VITALS — BP 139/81 | HR 118 | Temp 98.6°F | Resp 22 | Ht 64.0 in | Wt 272.0 lb

## 2017-04-09 DIAGNOSIS — Z23 Encounter for immunization: Secondary | ICD-10-CM | POA: Diagnosis not present

## 2017-04-09 DIAGNOSIS — R21 Rash and other nonspecific skin eruption: Secondary | ICD-10-CM | POA: Insufficient documentation

## 2017-04-09 DIAGNOSIS — Z8051 Family history of malignant neoplasm of kidney: Secondary | ICD-10-CM | POA: Insufficient documentation

## 2017-04-09 DIAGNOSIS — E079 Disorder of thyroid, unspecified: Secondary | ICD-10-CM

## 2017-04-09 DIAGNOSIS — Z79811 Long term (current) use of aromatase inhibitors: Secondary | ICD-10-CM | POA: Diagnosis not present

## 2017-04-09 DIAGNOSIS — Z79899 Other long term (current) drug therapy: Secondary | ICD-10-CM | POA: Insufficient documentation

## 2017-04-09 DIAGNOSIS — Z800 Family history of malignant neoplasm of digestive organs: Secondary | ICD-10-CM | POA: Insufficient documentation

## 2017-04-09 DIAGNOSIS — Z794 Long term (current) use of insulin: Secondary | ICD-10-CM | POA: Diagnosis not present

## 2017-04-09 DIAGNOSIS — K219 Gastro-esophageal reflux disease without esophagitis: Secondary | ICD-10-CM | POA: Diagnosis not present

## 2017-04-09 DIAGNOSIS — Z8042 Family history of malignant neoplasm of prostate: Secondary | ICD-10-CM | POA: Diagnosis not present

## 2017-04-09 DIAGNOSIS — Z923 Personal history of irradiation: Secondary | ICD-10-CM | POA: Insufficient documentation

## 2017-04-09 DIAGNOSIS — Z86000 Personal history of in-situ neoplasm of breast: Secondary | ICD-10-CM | POA: Diagnosis not present

## 2017-04-09 DIAGNOSIS — E119 Type 2 diabetes mellitus without complications: Secondary | ICD-10-CM | POA: Diagnosis not present

## 2017-04-09 DIAGNOSIS — M810 Age-related osteoporosis without current pathological fracture: Secondary | ICD-10-CM | POA: Insufficient documentation

## 2017-04-09 DIAGNOSIS — Z170 Estrogen receptor positive status [ER+]: Secondary | ICD-10-CM | POA: Insufficient documentation

## 2017-04-09 DIAGNOSIS — C50412 Malignant neoplasm of upper-outer quadrant of left female breast: Secondary | ICD-10-CM | POA: Insufficient documentation

## 2017-04-09 DIAGNOSIS — D0511 Intraductal carcinoma in situ of right breast: Secondary | ICD-10-CM

## 2017-04-09 LAB — COMPREHENSIVE METABOLIC PANEL
ALT: 13 U/L — ABNORMAL LOW (ref 14–54)
AST: 22 U/L (ref 15–41)
Albumin: 3.7 g/dL (ref 3.5–5.0)
Alkaline Phosphatase: 68 U/L (ref 38–126)
Anion gap: 10 (ref 5–15)
BUN: 14 mg/dL (ref 6–20)
CO2: 25 mmol/L (ref 22–32)
Calcium: 9 mg/dL (ref 8.9–10.3)
Chloride: 101 mmol/L (ref 101–111)
Creatinine, Ser: 0.96 mg/dL (ref 0.44–1.00)
GFR calc Af Amer: >60 mL/min (ref >60)
GFR calc non Af Amer: 54 mL/min — ABNORMAL LOW (ref >60)
Glucose, Bld: 237 mg/dL — ABNORMAL HIGH (ref 65–99)
Potassium: 4.2 mmol/L (ref 3.5–5.1)
Sodium: 136 mmol/L (ref 135–145)
Total Bilirubin: 0.7 mg/dL (ref 0.3–1.2)
Total Protein: 7.3 g/dL (ref 6.5–8.1)

## 2017-04-09 LAB — CBC WITH DIFFERENTIAL/PLATELET
Basophils Absolute: 0.1 10*3/uL (ref 0–0.1)
Basophils Relative: 1 %
Eosinophils Absolute: 0.1 10*3/uL (ref 0–0.7)
Eosinophils Relative: 1 %
HCT: 40.6 % (ref 35.0–47.0)
Hemoglobin: 13.7 g/dL (ref 12.0–16.0)
Lymphocytes Relative: 19 %
Lymphs Abs: 1.6 10*3/uL (ref 1.0–3.6)
MCH: 31.4 pg (ref 26.0–34.0)
MCHC: 33.9 g/dL (ref 32.0–36.0)
MCV: 92.8 fL (ref 80.0–100.0)
Monocytes Absolute: 0.5 10*3/uL (ref 0.2–0.9)
Monocytes Relative: 6 %
Neutro Abs: 6.2 10*3/uL (ref 1.4–6.5)
Neutrophils Relative %: 73 %
Platelets: 283 10*3/uL (ref 150–440)
RBC: 4.37 MIL/uL (ref 3.80–5.20)
RDW: 14.4 % (ref 11.5–14.5)
WBC: 8.4 10*3/uL (ref 3.6–11.0)

## 2017-04-09 MED ORDER — INFLUENZA VAC SPLIT QUAD 0.5 ML IM SUSY
0.5000 mL | PREFILLED_SYRINGE | Freq: Once | INTRAMUSCULAR | Status: AC
  Administered 2017-04-09: 0.5 mL via INTRAMUSCULAR

## 2017-04-09 MED ORDER — INFLUENZA VAC SPLIT QUAD 0.5 ML IM SUSY
PREFILLED_SYRINGE | INTRAMUSCULAR | Status: AC
  Filled 2017-04-09: qty 0.5

## 2017-04-09 MED ORDER — NYSTATIN 100000 UNIT/GM EX POWD: Freq: Four times a day (QID) | CUTANEOUS | 2 refills | Status: DC

## 2017-04-09 NOTE — Progress Notes (Signed)
Pt presents to clinic for f/u for h/o right DCIS.  Last mammogram performed in 08/2016.  Patient taking arimidex as directed and has not missed any dosing. Patient denies any new breast problems- no new breast lumps. She is taking the Fosmax/Caltrate as directed.  Patient reports intermittent episodes of diarrhea and constipation during the week. Pt reports that she has bowel movements every other day. She takes laxatives/stool softers as needed. Patient denies any chest pain or shortness of breath at this time while sitting in w/c, but admits to shortness of breath with ambulation.  Reports chronic lower extremity edema. She denies any fatigue with ADLs.

## 2017-04-10 LAB — CANCER ANTIGEN 27.29: CA 27.29: 16.2 U/mL (ref 0.0–38.6)

## 2017-06-26 ENCOUNTER — Ambulatory Visit
Admission: RE | Admit: 2017-06-26 | Discharge: 2017-06-26 | Disposition: A | Discharge status: Home / Self Care | Payer: Medicare Other | Source: Ambulatory Visit | Attending: Urgent Care | Admitting: Urgent Care

## 2017-06-26 DIAGNOSIS — M810 Age-related osteoporosis without current pathological fracture: Secondary | ICD-10-CM | POA: Insufficient documentation

## 2017-06-30 ENCOUNTER — Telehealth: Payer: Self-pay | Admitting: *Deleted

## 2017-06-30 NOTE — Telephone Encounter (Signed)
Called patient to discuss MD recommendation for her to receive Prolia injections.  Patient stated to me that she has her natural teeth.  Dr. Wayne Both is her dentist.  She is unsure at this time if she wants to do this.  She asked for more information.  I am sending her material regarding Prolia.  She also states she is "seeing a lot of other doctors at this time and cannot make a decision at this time.

## 2017-06-30 NOTE — Telephone Encounter (Signed)
-----   Message from Karen Kitchens, NP sent at 06/27/2017  8:15 AM EST ----- Regarding: RE: Let;s set her up for Prolia Is she edentulous? Office visit, or simply get dental clearance, labs, and bring her in for the injection? I looked at her pre-DXA survey from 06/26/2017, she has been off of her bisphosphonate for at least 6 months per her report. Clinic notes indicate the same - "off for an unknown period of time". I look back in her notes and her therapy has been sporadic secondary to side effects; mainly diarrhea.   Gaspar Bidding  ----- Message ----- From: Lequita Asal, MD Sent: 06/26/2017   2:12 PM To: Karen Kitchens, NP, Shirlean Kelly, RN Subject: Let;s set her up for Prolia                      ----- Message ----- From: Karen Kitchens, NP Sent: 06/26/2017  11:33 AM To: Lequita Asal, MD    ----- Message ----- From: Interface, Rad Results In Sent: 06/26/2017  10:42 AM To: Karen Kitchens, NP

## 2017-09-18 ENCOUNTER — Other Ambulatory Visit: Payer: Self-pay | Admitting: Urgent Care

## 2017-09-18 ENCOUNTER — Ambulatory Visit
Admission: RE | Admit: 2017-09-18 | Discharge: 2017-09-18 | Disposition: A | Discharge status: Home / Self Care | Payer: Medicare Other | Source: Ambulatory Visit | Attending: Urgent Care | Admitting: Urgent Care

## 2017-09-18 DIAGNOSIS — R921 Mammographic calcification found on diagnostic imaging of breast: Secondary | ICD-10-CM | POA: Insufficient documentation

## 2017-09-18 DIAGNOSIS — C50412 Malignant neoplasm of upper-outer quadrant of left female breast: Secondary | ICD-10-CM | POA: Diagnosis not present

## 2017-09-18 DIAGNOSIS — Z170 Estrogen receptor positive status [ER+]: Secondary | ICD-10-CM | POA: Insufficient documentation

## 2017-09-18 DIAGNOSIS — R928 Other abnormal and inconclusive findings on diagnostic imaging of breast: Secondary | ICD-10-CM

## 2017-09-18 DIAGNOSIS — Z853 Personal history of malignant neoplasm of breast: Secondary | ICD-10-CM

## 2017-10-01 ENCOUNTER — Ambulatory Visit
Admission: RE | Admit: 2017-10-01 | Discharge: 2017-10-01 | Disposition: A | Discharge status: Home / Self Care | Payer: Medicare Other | Source: Ambulatory Visit | Attending: Urgent Care | Admitting: Urgent Care

## 2017-10-01 DIAGNOSIS — R921 Mammographic calcification found on diagnostic imaging of breast: Secondary | ICD-10-CM | POA: Diagnosis not present

## 2017-10-01 DIAGNOSIS — R928 Other abnormal and inconclusive findings on diagnostic imaging of breast: Secondary | ICD-10-CM

## 2017-10-01 DIAGNOSIS — Z853 Personal history of malignant neoplasm of breast: Secondary | ICD-10-CM | POA: Diagnosis not present

## 2017-10-01 HISTORY — PX: BREAST BIOPSY: SHX20

## 2017-10-02 LAB — SURGICAL PATHOLOGY

## 2017-10-08 ENCOUNTER — Inpatient Hospital Stay: Payer: Medicare Other | Admitting: Hematology and Oncology

## 2017-10-08 ENCOUNTER — Inpatient Hospital Stay: Payer: Medicare Other

## 2017-10-08 DIAGNOSIS — Z7189 Other specified counseling: Secondary | ICD-10-CM | POA: Insufficient documentation

## 2017-10-08 NOTE — Progress Notes (Deleted)
[facility] day:  10/08/17  Chief Complaint: Danielle Bennett is a 83 y.o. female with a history of multi-focal right breast DCIS (1995) and stage IA Her2/neu + left breast cancer (2015) who is seen for 6 month assessment.   HPI:  The patient was last seen in the medical oncology clinic on 04/09/2017.  At that time, she denied any breast concerns. she denies any breast concerns.  Exam reveals intertriginous moisture related skin changes beneath her breasts. Labs were unremarkable.  CA27.29 was 16.2.  Bone density study on 06/26/2017 revealed osteoporosis with a T-score of -3.7 in the left femoral neck.  The patient was contacted.  Bilateral diagnostic mammogram on 09/18/2017 revealed a 1.2 cm group of indeterminate calcifications in the left breast adjacent to the lumpectomy site.  Left breast biopsy with clip placement on 10/01/2017 revealed cytologic changes c/w prior radiation therapy.  There was scar-like fibrosis and foreign body giant cell reaction.  Microcalcifications were present.  There was no atypia or malignancy.  During the interim,   Past Medical History:  Diagnosis Date  . Breast cancer ([place]) 1995   right breast cancer  . Breast cancer ([place]) 2015   left breast cancer  . Diabetes mellitus without complication ([place])   . GERD (gastroesophageal reflux disease)   . Thyroid disease     Past Surgical History:  Procedure Laterality Date  . BREAST BIOPSY Left 10/01/2017   affirm stereo/ path pending  . BREAST LUMPECTOMY Right 1995  . BREAST LUMPECTOMY Left 04/2014    Family History  Problem Relation Age of Onset  . Heart attack Mother   . Kidney cancer Brother   . Prostate cancer Brother   . Esophageal cancer Brother   . Breast cancer Neg Hx     Social History:  reports that she has never smoked. She has never used smokeless tobacco. She reports that she does not drink alcohol or use drugs.  She lives [place]  [place]. 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  She has family members who live on either side of her.  The patient is alone today.  Allergies:  Allergies  Allergen Reactions  . Simvastatin     Other reaction(s): Other (See Comments) Myalgias    Current Medications: Current Outpatient Medications  Medication Sig Dispense Refill  . alendronate (FOSAMAX) 70 MG tablet Take 70 mg by mouth once a week. Take with a full glass of water on an empty stomach.    Marland Kitchen anastrozole (ARIMIDEX) 1 MG tablet TAKE ONE TABLET BY MOUTH ONCE DAILY 90 tablet 3  . atorvastatin (LIPITOR) 40 MG tablet TAKE ONE TABLET BY MOUTH ONCE DAILY AT NIGHT TIME - MWF. and also takes a half tablet all other days.    . betamethasone valerate (VALISONE) 0.1 % cream Apply to skin as directed prn for itch    . Calcium Carb-Cholecalciferol (CALTRATE 600+D) 600-800 MG-UNIT TABS Take 2 tablets by mouth 2 (two) times daily.    . furosemide (LASIX) 20 MG tablet Take 20 mg by mouth daily as needed for edema.     Marland Kitchen glipiZIDE (GLUCOTROL XL) 10 MG 24 hr tablet TAKE 1 TABLET (10 MG TOTAL) BY MOUTH 2 (TWO) TIMES DAILY.    Marland Kitchen glucose blood test strip USE 3 (THREE) TIMES DAILY.    Marland Kitchen insulin lispro (HUMALOG) 100 UNIT/ML KiwkPen Use 2-7 units before each meal as directed    . Insulin  Pen Needle (B-D ULTRAFINE III SHORT PEN) 31G X 8 MM MISC USE 1  THREE TIMES DAILY AS DIRECTED    . levothyroxine (SYNTHROID, LEVOTHROID) 137 MCG tablet Take 137 mcg by mouth daily.     . Liraglutide (VICTOZA) 18 MG/3ML SOPN Inject 12 mg into the skin daily.     Marland Kitchen lisinopril (PRINIVIL,ZESTRIL) 10 MG tablet Take 10 mg by mouth daily.     . metFORMIN (GLUCOPHAGE) 1000 MG tablet Take 1,000 mg by mouth 2 (two) times daily with a meal.     . metroNIDAZOLE (METROCREAM) 0.75 % cream Apply 1 application topically daily.     Marland Kitchen nystatin (MYCOSTATIN/NYSTOP) powder Apply topically 4 (four) times daily. 15 g 2  . pioglitazone (ACTOS) 15 MG tablet Take  7.5 mg by mouth every other day.     . ranitidine (ZANTAC) 150 MG tablet TAKE ONE TABLET BY MOUTH TWICE DAILY     No current facility-administered medications for this visit.     Review of Systems:  GENERAL:  Feels fine.  No fevers or sweats.  Weight down 1 pound.  PERFORMANCE STATUS (ECOG):  1 HEENT:  No visual changes, runny nose, sore throat, mouth sores or tenderness. Lungs: No shortness of breath or cough.  No hemoptysis. Cardiac:  No chest pain, palpitations, orthopnea, or PND. GI:  Fosamax causes diarrhea.  No nausea, vomiting, constipation, melena or hematochezia. GU:  No urgency, frequency, dysuria, or hematuria. Musculoskeletal:  No back pain.  No joint pain.  No muscle tenderness. Extremities:  No pain or swelling. Skin:  No rashes or skin changes. Neuro:  No headache, numbness or weakness, balance or coordination issues. Endocrine:  Diabetes.  Thyroid disease on Synthroid.  No hot flashes or night sweats. Psych:  No mood changes, depression or anxiety. Pain:  No focal pain. Review of systems:  All other systems reviewed and found to be negative.  Physical Exam: There were no vitals taken for this visit. GENERAL:  Well developed, well nourished, heavyset woman sitting comfortably in a wheelchair in the exam room in no acute distress.  She has a cane at her side. MENTAL STATUS:  Alert and oriented to person, place and time. HEAD:  [place] styled short hair.  Normocephalic, atraumatic, face symmetric, no Cushingoid features. EYES:  Pupils equal round and reactive to light and accomodation.  No conjunctivitis or scleral icterus. ENT:  Oropharynx clear without lesion.  Tongue normal. Mucous membranes moist.  RESPIRATORY:  Clear to auscultation without rales, wheezes or rhonchi. CARDIOVASCULAR:  Regular rate and rhythm without murmur, rub or gallop. ABDOMEN:  Soft, non-tender, with active bowel sounds, and no hepatosplenomegaly.  No masses. BREAST:  Right breast s/p surgery and  radiation.  3.5 x 3 cm area of scarring at the 9 o'clock position (stable).  No new masses, skin changes or nipple discharge.  Left breast without masses, skin changes or nipple discharge. Moist under breasts (right > left). SKIN:  Facial rosacea.  Moist under breast, no fungal rash. EXTREMITIES: No edema, no skin discoloration or tenderness.  No palpable cords. LYMPH NODES: No palpable cervical, supraclavicular, axillary or inguinal adenopathy  NEUROLOGICAL: Unremarkable. PSYCH:  Appropriate.   No visits with results within 3 Day(s) from this visit.  Latest known visit with results is:  Hospital Outpatient Visit on 10/01/2017  Component Date Value Ref Range Status  . SURGICAL PATHOLOGY 10/01/2017    Final  Value:Surgical Pathology CASE: [id number] PATIENT: Danielle Bennett Surgical Pathology Report     SPECIMEN SUBMITTED: A. Breast, left, UOQ  CLINICAL HISTORY: H/O left lumpectomy.  CALCS medial to lumpectomy site  PRE-OPERATIVE DIAGNOSIS: Suspect benign CALCS, secretory vs atrophic tissue  POST-OPERATIVE DIAGNOSIS: None provided.     DIAGNOSIS: A.  LEFT BREAST, UPPER OUTER QUADRANT; STEREOTACTIC BIOPSY: - NEGATIVE FOR ATYPIA OR MALIGNANCY. - CYTOLOGIC CHANGES CONSISTENT WITH PRIOR RADIATION THERAPY. - SCAR-LIKE FIBROSIS AND FOCAL FOREIGN BODY GIANT CELL REACTION. - MICROCALCIFICATIONS PRESENT.  GROSS DESCRIPTION: A. Labeled: Left breast upper outer quadrant Received: in a formalin-filled [place] collection device Accompanying specimen radiograph: Yes Time/Date in fixative: 1:23 PM on 10/01/2017 Cold ischemic time: 3 minutes Total fixation time: 7 hours Core pieces: Multiple Measurement: Aggregate, 3.5 x 1.5 x 0.2 cm Description / comments: Yellow lobulat                         ed fibrofatty Inked: Black Entirely submitted in cassette(s):  1- section A 2- section B 3- section C 4- section D 5- remaining tissue  Final Diagnosis  performed by Delorse Lek, MD.   Electronically signed 10/02/2017 2:25:56PM The electronic signature indicates that the named Attending Pathologist has evaluated the specimen  Technical component performed at [facility], [address] [place], [place] [address] Lab: [phone] Dir: Rush Farmer, MD, MMM  Professional component performed at [facility], [facility], [place], [place], [place] [address] Lab: [phone] Dir: Dellia Nims. Rubinas, MD     Assessment:  Danielle Bennett is a 83 y.o. female with multi-focal right breast DCIS (1995) and stage IA Her2/neu + left breast cancer (2015).  She was diagnosed with multifocal DCIS of the right breast in 1995.  She underwent biopsy on 02/01/1994.  Pathology revealed multi-focal DCIS with comedo necrosis.  There was no evidence of lymphatic/vascular invasion.  Microcalcifications were present.  Margins were close (less than 1 mm).  She underwent re-excision on 03/01/1994 by Dr. Siri Cole at [facility].  There was no gross evidence of malignancy.  She received radiation.  She did not receive hormonal therapy.  She was diagnosed with Her2/neu + left breast cancer in 2015.  Stereotactic core biopsy on 04/05/2014 revealed invasive mammary carcinoma of no special type (1.1 mm) in a background of DCIS.  Wide excision of the mass in the left upper outer quadrant on 04/26/2014 revealed grade II DCIS, cribriform type with focal lobular cytology.  There was no residual invasive carcinoma.  No tumor was seen in 2 lymph nodes.  Tumor was ER positive (> 90%), PR positive (90%), and Her2/neu 3+.  Pathologic stage was T1aN0(sn) M0.  She received adjuvant left whole breast radiation in 06/2014.  She started Arimidex in 07/2014.  Bilateral mammogram on 08/22/2016 revealed no evidence of malignancy.  Bilateral diagnostic mammogram on 09/18/2017 revealed a 1.2 cm group of indeterminate calcifications in the left breast adjacent to the lumpectomy  site.  Left breast biopsy on 10/01/2017 revealed cytologic changes c/w prior radiation therapy.  There was scar-like fibrosis and foreign body giant cell reaction.  Microcalcifications were present.  There was no atypia or malignancy.  CA27.29 has been followed: 19.2 on 02/07/2016, 16.4 on 06/05/2016, 18.1 on 10/09/2016, and 16.2 on 04/09/2017.  Bone density study on 06/27/2015 revealed osteoporosis with a T score of -3.2 in the left femoral neck, -2.5 in the hip and -2.4 in the left forearm.  She was previously on Fosamax, but discontinued  for an unspecified period of time.  Bone density study on 06/26/2017 revealed osteoporosis with a T-score of -3.7 in the left femoral neck.  She restarted Fosamax in 06/2015.  She is on calcium and vitamin D.  Symptomatically,  she denies any breast concerns.  Exam reveals intertriginous moisture related skin changes beneath her breasts. Labs unremarkable.  Plan: 1.  Labs today:  CBC with diff, CMP, CA27.29 2.  Review interval mammogram. 3.  Review interval breast biopsy- changes c/w radiation.  No evidence of malignancy. 4.  Discuss interval bone density.  Bone density has worsened.  She has progressive osteoporosis.   Continue Arimidex as previously prescribed. 3.  Continue Fosamax, calcium, and vitamin D as previously prescribed. 4.  Rx: Nystatin power topically 4 times a day PRN.  5.  Schedule DXA on 06/26/2017  5.  Schedule mammogram on 08/22/2017. 6.  Discussed influenza vaccination. Patient with adequate blood counts. Will proceed with vaccination today.  7.  RTC in 6 months for MD assessment and labs (CBC with diff, CMP, CA27.29). Clinic appointment to be after mammogram.    Lequita Asal, MD  10/08/2017, 5:40 AM   I saw and evaluated the patient, participating in the key portions of the service and reviewing pertinent diagnostic studies and records.  I reviewed the nurse practitioner's note and agree with the findings and the plan.  The  assessment and plan were discussed with the patient.  A few questions were asked by the patient and answered.   Nolon Stalls, MD 10/08/2017, 5:41 AM

## 2017-10-15 ENCOUNTER — Other Ambulatory Visit: Payer: Self-pay

## 2017-10-15 ENCOUNTER — Encounter: Payer: Self-pay | Admitting: Hematology and Oncology

## 2017-10-15 ENCOUNTER — Inpatient Hospital Stay: Payer: Medicare Other

## 2017-10-15 ENCOUNTER — Inpatient Hospital Stay: Payer: Medicare Other | Attending: Hematology and Oncology | Admitting: Hematology and Oncology

## 2017-10-15 VITALS — BP 143/84 | HR 91 | Temp 99.1°F | Wt 258.2 lb

## 2017-10-15 DIAGNOSIS — Z7984 Long term (current) use of oral hypoglycemic drugs: Secondary | ICD-10-CM | POA: Insufficient documentation

## 2017-10-15 DIAGNOSIS — Z7189 Other specified counseling: Secondary | ICD-10-CM

## 2017-10-15 DIAGNOSIS — M810 Age-related osteoporosis without current pathological fracture: Secondary | ICD-10-CM | POA: Insufficient documentation

## 2017-10-15 DIAGNOSIS — Z79899 Other long term (current) drug therapy: Secondary | ICD-10-CM

## 2017-10-15 DIAGNOSIS — E119 Type 2 diabetes mellitus without complications: Secondary | ICD-10-CM | POA: Insufficient documentation

## 2017-10-15 DIAGNOSIS — C50412 Malignant neoplasm of upper-outer quadrant of left female breast: Secondary | ICD-10-CM

## 2017-10-15 DIAGNOSIS — Z86000 Personal history of in-situ neoplasm of breast: Secondary | ICD-10-CM | POA: Diagnosis not present

## 2017-10-15 DIAGNOSIS — Z170 Estrogen receptor positive status [ER+]: Secondary | ICD-10-CM

## 2017-10-15 DIAGNOSIS — D0511 Intraductal carcinoma in situ of right breast: Secondary | ICD-10-CM

## 2017-10-15 DIAGNOSIS — Z79811 Long term (current) use of aromatase inhibitors: Secondary | ICD-10-CM

## 2017-10-15 DIAGNOSIS — Z923 Personal history of irradiation: Secondary | ICD-10-CM | POA: Diagnosis not present

## 2017-10-15 DIAGNOSIS — Z794 Long term (current) use of insulin: Secondary | ICD-10-CM | POA: Insufficient documentation

## 2017-10-15 DIAGNOSIS — B372 Candidiasis of skin and nail: Secondary | ICD-10-CM | POA: Insufficient documentation

## 2017-10-15 DIAGNOSIS — Z7901 Long term (current) use of anticoagulants: Secondary | ICD-10-CM

## 2017-10-15 LAB — CBC WITH DIFFERENTIAL/PLATELET
BASOS ABS: 0.1 10*3/uL (ref 0–0.1)
BASOS PCT: 1 %
EOS ABS: 0.1 10*3/uL (ref 0–0.7)
Eosinophils Relative: 1 %
HCT: 40.4 % (ref 35.0–47.0)
HEMOGLOBIN: 13.5 g/dL (ref 12.0–16.0)
Lymphocytes Relative: 14 %
Lymphs Abs: 1.4 10*3/uL (ref 1.0–3.6)
MCH: 30 pg (ref 26.0–34.0)
MCHC: 33.4 g/dL (ref 32.0–36.0)
MCV: 89.8 fL (ref 80.0–100.0)
Monocytes Absolute: 0.5 10*3/uL (ref 0.2–0.9)
Monocytes Relative: 5 %
NEUTROS PCT: 79 %
Neutro Abs: 7.6 10*3/uL — ABNORMAL HIGH (ref 1.4–6.5)
Platelets: 264 10*3/uL (ref 150–440)
RBC: 4.5 MIL/uL (ref 3.80–5.20)
RDW: 15 % — ABNORMAL HIGH (ref 11.5–14.5)
WBC: 9.6 10*3/uL (ref 3.6–11.0)

## 2017-10-15 LAB — COMPREHENSIVE METABOLIC PANEL
ALK PHOS: 70 U/L (ref 38–126)
ALT: 13 U/L — AB (ref 14–54)
AST: 21 U/L (ref 15–41)
Albumin: 3.7 g/dL (ref 3.5–5.0)
Anion gap: 11 (ref 5–15)
BUN: 16 mg/dL (ref 6–20)
CALCIUM: 9.1 mg/dL (ref 8.9–10.3)
CO2: 24 mmol/L (ref 22–32)
CREATININE: 0.92 mg/dL (ref 0.44–1.00)
Chloride: 103 mmol/L (ref 101–111)
GFR, EST NON AFRICAN AMERICAN: 57 mL/min — AB (ref >60)
Glucose, Bld: 233 mg/dL — ABNORMAL HIGH (ref 65–99)
Potassium: 4.6 mmol/L (ref 3.5–5.1)
Sodium: 138 mmol/L (ref 135–145)
TOTAL PROTEIN: 7.6 g/dL (ref 6.5–8.1)
Total Bilirubin: 0.5 mg/dL (ref 0.3–1.2)

## 2017-10-15 NOTE — Progress Notes (Signed)
Patient here for follow up. States she had mini stroke in December 2018, seen at [facility]. Skin cancer in nose, seen by Dr. Phillip Heal.

## 2017-10-15 NOTE — Progress Notes (Signed)
[facility] day:  10/15/17  Chief Complaint: Danielle Bennett is a 83 y.o. female with a history of multi-focal right breast DCIS (1995) and stage IA Her2/neu + left breast cancer (2015) who is seen for 6 month assessment.   HPI:  The patient was last seen in the medical oncology clinic on 04/09/2017.  At that time, she denied any breast concerns.  Exam reveals intertriginous moisture related skin changes beneath her breasts. Labs were unremarkable.  CA27.29 was 16.2.  Bone density study on 06/26/2017 revealed osteoporosis with a T-score of -3.7 in the left femoral neck.  The patient was contacted.  She states that she is no longer taking her prescribed oral bisphosphonate due to diarrhea (since 2018).   Bilateral diagnostic mammogram on 09/18/2017 revealed a 1.2 cm group of indeterminate calcifications in the left breast adjacent to the lumpectomy site.  Left breast biopsy with clip placement on 10/01/2017 revealed cytologic changes c/w prior radiation therapy.  There was scar-like fibrosis and foreign body giant cell reaction.  Microcalcifications were present.  There was no atypia or malignancy.  During the interim, she has done well. She continues to have redness, due to moisture, beneath both of her breasts. She is using the Nystatin powder PRN. She denies fevers, sweats, or areas of palpable adenopathy. She denies breast concerns. Patient does not perform a monthly self breast examination as recommended.   Patient advising that she had a "mini stroke" since her last visit. She describes dropping her pills and slurring her words. Patient was at [facility] for 4 days. She states, "I was down there all of that time, but I was never put in a room".  She was put on Eliquis and metoprolol.  She is losing weight.  Weight has decreased by 14 pounds since 03/2017. She has not had any recent infections. She denies pain in the clinic today.    Past Medical  History:  Diagnosis Date  . Breast cancer ([place]) 1995   right breast cancer  . Breast cancer ([place]) 2015   left breast cancer  . Diabetes mellitus without complication ([place])   . GERD (gastroesophageal reflux disease)   . Thyroid disease     Past Surgical History:  Procedure Laterality Date  . BREAST BIOPSY Left 10/01/2017   affirm stereo/ path pending  . BREAST LUMPECTOMY Right 1995  . BREAST LUMPECTOMY Left 04/2014    Family History  Problem Relation Age of Onset  . Heart attack Mother   . Kidney cancer Brother   . Prostate cancer Brother   . Esophageal cancer Brother   . Breast cancer Neg Hx     Social History:  reports that she has never smoked. She has never used smokeless tobacco. She reports that she does not drink alcohol or use drugs.  She lives [place]. 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  She has family members who live on either side of her.  The patient is alone today.  Allergies:  Allergies  Allergen Reactions  . Simvastatin     Other reaction(s): Other (See Comments) Myalgias    Current Medications: Current Outpatient Medications  Medication Sig Dispense Refill  . anastrozole (ARIMIDEX) 1 MG tablet TAKE ONE TABLET BY MOUTH ONCE DAILY 90 tablet 3  . apixaban (ELIQUIS) 5 MG TABS tablet Take 5 mg by mouth daily.     Marland Kitchen atorvastatin (LIPITOR) 40 MG tablet TAKE ONE TABLET  BY MOUTH ONCE DAILY AT NIGHT TIME - MWF. and also takes a half tablet all other days.    . betamethasone valerate (VALISONE) 0.1 % cream Apply to skin as directed prn for itch    . Calcium Carb-Cholecalciferol (CALTRATE 600+D) 600-800 MG-UNIT TABS Take 2 tablets by mouth 2 (two) times daily.    . furosemide (LASIX) 20 MG tablet Take 20 mg by mouth daily as needed for edema.     Marland Kitchen glipiZIDE (GLUCOTROL XL) 10 MG 24 hr tablet TAKE 1 TABLET (10 MG TOTAL) BY MOUTH 2 (TWO) TIMES DAILY.    Marland Kitchen glucose blood test strip USE 3 (THREE) TIMES DAILY.    Marland Kitchen insulin  lispro (HUMALOG) 100 UNIT/ML KiwkPen Use 2-7 units before each meal as directed    . Insulin Pen Needle (B-D ULTRAFINE III SHORT PEN) 31G X 8 MM MISC USE 1  THREE TIMES DAILY AS DIRECTED    . levothyroxine (SYNTHROID, LEVOTHROID) 137 MCG tablet Take 137 mcg by mouth daily.     . Liraglutide (VICTOZA) 18 MG/3ML SOPN Inject 12 mg into the skin daily.     Marland Kitchen lisinopril (PRINIVIL,ZESTRIL) 10 MG tablet Take 10 mg by mouth daily.     . metFORMIN (GLUCOPHAGE) 1000 MG tablet Take 1,000 mg by mouth 2 (two) times daily with a meal.     . metoprolol succinate (TOPROL-XL) 25 MG 24 hr tablet Take 25 mg by mouth daily.    . metroNIDAZOLE (METROCREAM) 0.75 % cream Apply 1 application topically daily.     Marland Kitchen nystatin (MYCOSTATIN/NYSTOP) powder Apply topically 4 (four) times daily. 15 g 2  . PREVIDENT 5000 DRY MOUTH 1.1 % GEL dental gel BRUSH WITH PASTE 2 TO 3 TIMES PER DAY ** DO NOT RINSE **  5  . ranitidine (ZANTAC) 150 MG tablet TAKE ONE TABLET BY MOUTH TWICE DAILY     No current facility-administered medications for this visit.     Review of Systems:  GENERAL:  Feels fine.  No fevers or sweats.  Weight down 14 pounds in 6 months.  PERFORMANCE STATUS (ECOG):  1 HEENT:  No visual changes, runny nose, sore throat, mouth sores or tenderness.  Sees dentist in 12/2017. Lungs: No shortness of breath or cough.  No hemoptysis. Cardiac:  No chest pain, palpitations, orthopnea, or PND. GI:  Fosamax caused diarrhea; discontinued.  No nausea, vomiting, constipation, melena or hematochezia. GU:  No urgency, frequency, dysuria, or hematuria. Musculoskeletal:  No back pain.  No joint pain.  No muscle tenderness. Extremities:  No pain or swelling. Skin:  No rashes or skin changes. Neuro:  Mini CVA in 05/2017 (see HPI).  No residual deficits.  No headache, numbness or weakness, balance or coordination issues. Endocrine:  Diabetes.  Thyroid disease on Synthroid.  No hot flashes or night sweats. Psych:  No mood changes,  depression or anxiety. Pain:  No focal pain. Review of systems:  All other systems reviewed today and found to be negative except as noted above.   Physical Exam: Blood pressure (!) 143/84, pulse 91, temperature 99.1 F (37.3 C), temperature source Tympanic, weight 258 lb 3.2 oz (117.1 kg). GENERAL:  Well developed, well nourished, elderly woman sitting comfortably in a wheelchair in the exam room in no acute distress.  She has a cane at her side.  She is examined in her chair. MENTAL STATUS:  Alert and oriented to person, place and time. HEAD:  [place] styled hair.  Normocephalic, atraumatic, face symmetric, no Cushingoid features.  EYES:  Pupils equal round and reactive to light and accomodation.  No conjunctivitis or scleral icterus. ENT:  Oropharynx clear without lesion.  Tongue normal. Mucous membranes moist.  RESPIRATORY:  Clear to auscultation without rales, wheezes or rhonchi. CARDIOVASCULAR:  Regular rate and rhythm without murmur, rub or gallop. ABDOMEN:  Soft, non-tender, with active bowel sounds, and no hepatosplenomegaly.  No masses. BREAST:  Right breast s/p surgery and radiation.  3 x 3 cm area of scarring at the 9 o'clock position (stable).  No new masses, skin changes or nipple discharge.  Left breast 2 cm scarring under lateral incision.  No discrete masses, skin changes or nipple discharge.  Moist under breasts with early fungal infection under left breast. SKIN:  Facial rosacea.  Moist under breasts.  Early fungal rash under left breast. EXTREMITIES: No edema, no skin discoloration or tenderness.  No palpable cords. LYMPH NODES: No palpable cervical, supraclavicular, axillary or inguinal adenopathy  NEUROLOGICAL: Unremarkable. PSYCH:  Appropriate. Physical exam:  Complete exam performed today as above with any/all changes from prior exam noted.     Appointment on 10/15/2017  Component Date Value Ref Range Status  . Sodium 10/15/2017 138  135 - 145 mmol/L Final  . Potassium  10/15/2017 4.6  3.5 - 5.1 mmol/L Final  . Chloride 10/15/2017 103  101 - 111 mmol/L Final  . CO2 10/15/2017 24  22 - 32 mmol/L Final  . Glucose, Bld 10/15/2017 233* 65 - 99 mg/dL Final  . BUN 10/15/2017 16  6 - 20 mg/dL Final  . Creatinine, Ser 10/15/2017 0.92  0.44 - 1.00 mg/dL Final  . Calcium 10/15/2017 9.1  8.9 - 10.3 mg/dL Final  . Total Protein 10/15/2017 7.6  6.5 - 8.1 g/dL Final  . Albumin 10/15/2017 3.7  3.5 - 5.0 g/dL Final  . AST 10/15/2017 21  15 - 41 U/L Final  . ALT 10/15/2017 13* 14 - 54 U/L Final  . Alkaline Phosphatase 10/15/2017 70  38 - 126 U/L Final  . Total Bilirubin 10/15/2017 0.5  0.3 - 1.2 mg/dL Final  . GFR calc non Af Amer 10/15/2017 57* >60 mL/min Final  . GFR calc Af Amer 10/15/2017 >60  >60 mL/min Final   Comment: (NOTE) The eGFR has been calculated using the CKD EPI equation. This calculation has not been validated in all clinical situations. eGFR's persistently <60 mL/min signify possible Chronic Kidney Disease.   Georgiann Hahn gap 10/15/2017 11  5 - 15 Final   Performed at [facility], [address], [place], [place] [address]  . WBC 10/15/2017 9.6  3.6 - 11.0 K/uL Final  . RBC 10/15/2017 4.50  3.80 - 5.20 MIL/uL Final  . Hemoglobin 10/15/2017 13.5  12.0 - 16.0 g/dL Final  . HCT 10/15/2017 40.4  35.0 - 47.0 % Final  . MCV 10/15/2017 89.8  80.0 - 100.0 fL Final  . MCH 10/15/2017 30.0  26.0 - 34.0 pg Final  . MCHC 10/15/2017 33.4  32.0 - 36.0 g/dL Final  . RDW 10/15/2017 15.0* 11.5 - 14.5 % Final  . Platelets 10/15/2017 264  150 - 440 K/uL Final  . Neutrophils Relative % 10/15/2017 79  % Final  . Neutro Abs 10/15/2017 7.6* 1.4 - 6.5 K/uL Final  . Lymphocytes Relative 10/15/2017 14  % Final  . Lymphs Abs 10/15/2017 1.4  1.0 - 3.6 K/uL Final  . Monocytes Relative 10/15/2017 5  % Final  . Monocytes Absolute 10/15/2017 0.5  0.2 - 0.9 K/uL  Final  . Eosinophils Relative 10/15/2017 1  % Final  . Eosinophils Absolute 10/15/2017 0.1  0 - 0.7 K/uL  Final  . Basophils Relative 10/15/2017 1  % Final  . Basophils Absolute 10/15/2017 0.1  0 - 0.1 K/uL Final   Performed at [facility], [address] [place], [place] [address]    Assessment:  Exie Chrismer is a 83 y.o. female with multi-focal right breast DCIS (1995) and stage IA Her2/neu + left breast cancer (2015).  She was diagnosed with multifocal DCIS of the right breast in 1995.  She underwent biopsy on 02/01/1994.  Pathology revealed multi-focal DCIS with comedo necrosis.  There was no evidence of lymphatic/vascular invasion.  Microcalcifications were present.  Margins were close (less than 1 mm).  She underwent re-excision on 03/01/1994 by Dr. Siri Cole at [facility].  There was no gross evidence of malignancy.  She received radiation.  She did not receive hormonal therapy.  She was diagnosed with Her2/neu + left breast cancer in 2015.  Stereotactic core biopsy on 04/05/2014 revealed invasive mammary carcinoma of no special type (1.1 mm) in a background of DCIS.  Wide excision of the mass in the left upper outer quadrant on 04/26/2014 revealed grade II DCIS, cribriform type with focal lobular cytology.  There was no residual invasive carcinoma.  No tumor was seen in 2 lymph nodes.  Tumor was ER positive (> 90%), PR positive (90%), and Her2/neu 3+.  Pathologic stage was T1aN0(sn) M0.  She received adjuvant left whole breast radiation in 06/2014.  She started Arimidex in 07/2014.  Bilateral mammogram on 08/22/2016 revealed no evidence of malignancy.  Bilateral diagnostic mammogram on 09/18/2017 revealed a 1.2 cm group of indeterminate calcifications in the left breast adjacent to the lumpectomy site.  Left breast biopsy on 10/01/2017 revealed cytologic changes c/w prior radiation therapy.  There was scar-like fibrosis and foreign body giant cell reaction.  Microcalcifications were present.  There was no atypia or malignancy.  CA27.29 has been followed: 19.2 on 02/07/2016,  16.4 on 06/05/2016, 18.1 on 10/09/2016, and 16.2 on 04/09/2017.  Bone density study on 06/27/2015 revealed osteoporosis with a T score of -3.2 in the left femoral neck, -2.5 in the hip and -2.4 in the left forearm.  She was previously on Fosamax, but discontinued for an unspecified period of time.  Bone density study on 06/26/2017 revealed osteoporosis with a T-score of -3.7 in the left femoral neck.  She restarted Fosamax in 06/2015 (discontinued 2018).  She is on calcium and vitamin D.  Symptomatically,  she denies any breast concerns. She has had a recent "mini stoke".  Exam reveals a 3 cm palpable scarring just inferior to surgical scar in her RIGHT breast, and a 2 cm area of fibrous scarring in her LEFT breast. She has continued intertriginous moisture related skin changes beneath her breasts. She uses Nystatin powder PRN. Labs are unremarkable.  Plan: 1.  Labs today:  CBC with diff, CMP, CA27.29 2.  Review interval mammogram- indeterminate calcifications necessitating biopsy. 3.  Review interval breast biopsy- changes c/w radiation.  No evidence of malignancy. 4.  Discuss interval bone density.  Bone density has worsened.  She discontinued Fosamax last year secondary to diarrhea.  She has progressive osteoporosis. Ten-year fracture probability by FRAX of 3% or greater for hip fracture or 20% or greater for major osteoporotic fracture. Discuss the use of Prolia to prevent bone thinning/loss associated with hormonal therapy. Side effects of this medication reviewed.  Discuss the need for dental clearance prior to beginning Prolia, as this medication increases the risk of dental complications such as osteonecrosis of the jaw.  Patient electing to "think about it some more".  Patient encouraged to take calcium 1200 mg and vitamin D 800 IU daily. 5.  Discussed continued hormonal therapy. Patient asking to change due to bone thinning. Discuss tamoxifen. Side effects reviewed. Potential side effects (hot  flashes, weight gain, cataract formation, thrombosis, uterine cancer) of tamoxifen reviewed in detail.  Discuss requirements associated with this medication, which include annual eye and pelvic examinations. Given patient's limited mobility, her risk for VTE is higher. Patient decided against switching to tamoxifen given her recent mini-CVA and concern for DVT/PE.  Discuss discontinuing hormonal therapy.  Discuss tiny high risk breast tumor (Her2/neu +) in field of DCIS.  At this point, patient wishes to remain on Arimidex daily as prescribed.  6.  Continue Arimidex as previously prescribed. 7.  RTC in 6 months for MD assessment and labs (CBC with diff, CMP, CA27.29).     Honor Loh, NP  10/15/2017, 10:08 AM   I saw and evaluated the patient, participating in the key portions of the service and reviewing pertinent diagnostic studies and records.  I reviewed the nurse practitioner's note and agree with the findings and the plan.  The assessment and plan were discussed with the patient.  More than 25 minutes were spent with the patient of which > 50% of the time was spent on counseling.  Extensive questions were asked by the patient and answered.   Nolon Stalls, MD 10/15/2017, 10:08 AM

## 2017-10-16 LAB — CANCER ANTIGEN 27.29: CA 27.29: 17.4 U/mL (ref 0.0–38.6)

## 2017-10-23 ENCOUNTER — Other Ambulatory Visit: Payer: Self-pay | Admitting: Hematology and Oncology

## 2017-10-23 ENCOUNTER — Other Ambulatory Visit: Payer: Self-pay | Admitting: Urgent Care

## 2017-10-23 DIAGNOSIS — C50412 Malignant neoplasm of upper-outer quadrant of left female breast: Secondary | ICD-10-CM

## 2017-10-23 DIAGNOSIS — Z170 Estrogen receptor positive status [ER+]: Principal | ICD-10-CM

## 2017-10-23 DIAGNOSIS — M810 Age-related osteoporosis without current pathological fracture: Secondary | ICD-10-CM

## 2017-10-23 DIAGNOSIS — D0511 Intraductal carcinoma in situ of right breast: Secondary | ICD-10-CM

## 2017-10-23 MED ORDER — NYSTATIN 100000 UNIT/GM EX POWD: Freq: Four times a day (QID) | CUTANEOUS | 2 refills | Status: DC

## 2018-04-11 ENCOUNTER — Other Ambulatory Visit: Payer: Self-pay | Admitting: Hematology and Oncology

## 2018-04-11 DIAGNOSIS — Z170 Estrogen receptor positive status [ER+]: Principal | ICD-10-CM

## 2018-04-11 DIAGNOSIS — C50412 Malignant neoplasm of upper-outer quadrant of left female breast: Secondary | ICD-10-CM

## 2018-04-15 ENCOUNTER — Other Ambulatory Visit: Payer: Medicare Other

## 2018-04-15 ENCOUNTER — Ambulatory Visit: Payer: Medicare Other | Admitting: Hematology and Oncology

## 2018-04-22 ENCOUNTER — Inpatient Hospital Stay: Payer: Medicare Other | Attending: Hematology and Oncology | Admitting: Hematology and Oncology

## 2018-04-22 ENCOUNTER — Encounter: Payer: Self-pay | Admitting: Hematology and Oncology

## 2018-04-22 ENCOUNTER — Inpatient Hospital Stay: Payer: Medicare Other

## 2018-04-22 VITALS — BP 107/76 | HR 80 | Temp 97.3°F | Resp 18 | Wt 249.0 lb

## 2018-04-22 DIAGNOSIS — Z923 Personal history of irradiation: Secondary | ICD-10-CM | POA: Insufficient documentation

## 2018-04-22 DIAGNOSIS — Z79811 Long term (current) use of aromatase inhibitors: Secondary | ICD-10-CM | POA: Insufficient documentation

## 2018-04-22 DIAGNOSIS — C50412 Malignant neoplasm of upper-outer quadrant of left female breast: Secondary | ICD-10-CM | POA: Diagnosis present

## 2018-04-22 DIAGNOSIS — Z170 Estrogen receptor positive status [ER+]: Secondary | ICD-10-CM | POA: Insufficient documentation

## 2018-04-22 DIAGNOSIS — M810 Age-related osteoporosis without current pathological fracture: Secondary | ICD-10-CM | POA: Insufficient documentation

## 2018-04-22 DIAGNOSIS — D0511 Intraductal carcinoma in situ of right breast: Secondary | ICD-10-CM

## 2018-04-22 DIAGNOSIS — Z86000 Personal history of in-situ neoplasm of breast: Secondary | ICD-10-CM | POA: Diagnosis not present

## 2018-04-22 LAB — COMPREHENSIVE METABOLIC PANEL
ALT: 17 U/L (ref 0–44)
AST: 26 U/L (ref 15–41)
Albumin: 3.6 g/dL (ref 3.5–5.0)
Alkaline Phosphatase: 78 U/L (ref 38–126)
Anion gap: 15 (ref 5–15)
BUN: 32 mg/dL — ABNORMAL HIGH (ref 8–23)
CO2: 27 mmol/L (ref 22–32)
Calcium: 10.1 mg/dL (ref 8.9–10.3)
Chloride: 95 mmol/L — ABNORMAL LOW (ref 98–111)
Creatinine, Ser: 1.61 mg/dL — ABNORMAL HIGH (ref 0.44–1.00)
GFR calc Af Amer: 33 mL/min — ABNORMAL LOW (ref >60)
GFR calc non Af Amer: 29 mL/min — ABNORMAL LOW (ref >60)
Glucose, Bld: 237 mg/dL — ABNORMAL HIGH (ref 70–99)
Potassium: 4.5 mmol/L (ref 3.5–5.1)
Sodium: 137 mmol/L (ref 135–145)
Total Bilirubin: 0.5 mg/dL (ref 0.3–1.2)
Total Protein: 7.1 g/dL (ref 6.5–8.1)

## 2018-04-22 LAB — CBC WITH DIFFERENTIAL/PLATELET
Abs Immature Granulocytes: 0.05 10*3/uL (ref 0.00–0.07)
Basophils Absolute: 0.1 10*3/uL (ref 0.0–0.1)
Basophils Relative: 1 %
Eosinophils Absolute: 0.2 10*3/uL (ref 0.0–0.5)
Eosinophils Relative: 3 %
HCT: 35.2 % — ABNORMAL LOW (ref 36.0–46.0)
Hemoglobin: 11.9 g/dL — ABNORMAL LOW (ref 12.0–15.0)
Immature Granulocytes: 1 %
Lymphocytes Relative: 21 %
Lymphs Abs: 1.9 10*3/uL (ref 0.7–4.0)
MCH: 31.7 pg (ref 26.0–34.0)
MCHC: 33.8 g/dL (ref 30.0–36.0)
MCV: 93.9 fL (ref 80.0–100.0)
Monocytes Absolute: 0.6 10*3/uL (ref 0.1–1.0)
Monocytes Relative: 6 %
Neutro Abs: 6.2 10*3/uL (ref 1.7–7.7)
Neutrophils Relative %: 68 %
Platelets: 240 10*3/uL (ref 150–400)
RBC: 3.75 MIL/uL — ABNORMAL LOW (ref 3.87–5.11)
RDW: 13.4 % (ref 11.5–15.5)
WBC: 9 10*3/uL (ref 4.0–10.5)
nRBC: 0 % (ref 0.0–0.2)

## 2018-04-22 NOTE — Progress Notes (Signed)
[facility] day:  04/22/18  Chief Complaint: Danielle Bennett is a 83 y.o. female with a history of multi-focal right breast DCIS (1995) and stage IA Her2/neu + left breast cancer (2015) who is seen for 6 month assessment.   HPI:  The patient was last seen in the medical oncology clinic on 10/15/2017.  At that time, she denied any breast concerns. She had a recent "mini stoke".  Exam revealed a 3 cm palpable scarring just inferior to surgical scar in her RIGHT breast, and a 2 cm area of fibrous scarring in her LEFT breast. She had continued intertriginous moisture related skin changes beneath her breasts. She used Nystatin powder PRN. Labs were unremarkable.  The patient was admitted to [facility] from 11/12/2017 - 11/28/2017.  Notes reviewed.  She presented 11/12/2017 as a code stroke, but was ultimately canceled given lack of focal deficits. While in the ER, developed acute hypoxic hypercarbic respiratory failure requiring intubation secondary to CAP. She was admitted to [facility] MICU and transferred to [facility] on 11/14/17 following extubation for continued treatment of her CAP vs. aspiration pneumonia. Following resolution of PNA, she was found to have temporarily depressed EF (15-20%), for which she required significant diuresis.   She was noted to have Afib w/RVR with low voltage EKG during her hospitalization. Echo on 05/28 showed EF 15-20%, decreased from echo 06/19/17 which showed EF 65-70%. Following aggressive diuresis and improved HR control, repeat echo 11/26/2017 showed LVEF >70%, suggesting temporarily depressed EF due to tachyarrhythmia-induced cardiomyopathy secondary to hypervolemia and uncontrolled afib in setting of acute respiratory illness. She was aggressively diuresed with IV Lasix. She was transitioned to oral Lasix (40 mg PO daily, with additional PRN)  She had persistent HRs in the 160s with concomittant low BP in the setting of  intubation, so she was loaded with amiodarone which was discontinued before completion of load.  he wass still having Afib w/ RVR so she was started maintenance metoprolol dosing. Given inadequate rate control with metoprolol and depressed EF, digoxin started 05/302019 with initial improvement in HR. However, subsequently developed bradycardia, so digoxin stopped prior to discharge. Metoprolol was continued at 50 mg BID and consolidated to succinate on 11/28/2017 (100 mg daily). Ziopatch was placed prior to discharge.   She was discharged to a skilled nursing facility, [place].  She was then discharged home on 01/02/2018.  She has been doing well with a walker and a cane.  She is on fall precautions.  She states "I have so much going on".  She sees the dentist next week.  She remains on Femara.  She denies any breast concerns.   Past Medical History:  Diagnosis Date  . Breast cancer ([place]) 1995   right breast cancer  . Breast cancer ([place]) 2015   left breast cancer  . Diabetes mellitus without complication ([place])   . GERD (gastroesophageal reflux disease)   . Thyroid disease     Past Surgical History:  Procedure Laterality Date  . BREAST BIOPSY Left 10/01/2017   affirm stereo/ path pending  . BREAST LUMPECTOMY Right 1995  . BREAST LUMPECTOMY Left 04/2014    Family History  Problem Relation Age of Onset  . Heart attack Mother   . Kidney cancer Brother   . Prostate cancer Brother   . Esophageal cancer Brother   . Breast cancer Neg Hx     Social History:  reports that she has never smoked. She has never used  smokeless tobacco. She reports that she does not drink alcohol or use drugs.  She lives [place]. 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  She has family members who live on either side of her.  The patient is alone today.  Her brother brought her to clinic.  Allergies:  Allergies  Allergen Reactions  . Simvastatin     Other  reaction(s): Other (See Comments) Myalgias    Current Medications: Current Outpatient Medications  Medication Sig Dispense Refill  . anastrozole (ARIMIDEX) 1 MG tablet TAKE 1 TABLET BY MOUTH ONCE DAILY 90 tablet 3  . apixaban (ELIQUIS) 5 MG TABS tablet Take 5 mg by mouth daily.     Marland Kitchen atorvastatin (LIPITOR) 40 MG tablet TAKE ONE TABLET BY MOUTH ONCE DAILY AT NIGHT TIME - MWF. and also takes a half tablet all other days.    . betamethasone valerate (VALISONE) 0.1 % cream Apply to skin as directed prn for itch    . Calcium Carb-Cholecalciferol (CALTRATE 600+D) 600-800 MG-UNIT TABS Take 2 tablets by mouth 2 (two) times daily.    . furosemide (LASIX) 20 MG tablet Take 20 mg by mouth daily as needed for edema.     Marland Kitchen glipiZIDE (GLUCOTROL XL) 10 MG 24 hr tablet TAKE 1 TABLET (10 MG TOTAL) BY MOUTH 2 (TWO) TIMES DAILY.    Marland Kitchen glucose blood test strip USE 3 (THREE) TIMES DAILY.    Marland Kitchen insulin lispro (HUMALOG) 100 UNIT/ML KiwkPen Use 2-7 units before each meal as directed    . Insulin Pen Needle (B-D ULTRAFINE III SHORT PEN) 31G X 8 MM MISC USE 1  THREE TIMES DAILY AS DIRECTED    . levothyroxine (SYNTHROID, LEVOTHROID) 137 MCG tablet Take 137 mcg by mouth daily.     . Liraglutide (VICTOZA) 18 MG/3ML SOPN Inject 12 mg into the skin daily.     Marland Kitchen lisinopril (PRINIVIL,ZESTRIL) 20 MG tablet Take 20 mg by mouth daily.  3  . metFORMIN (GLUCOPHAGE) 1000 MG tablet Take 1,000 mg by mouth 2 (two) times daily with a meal.     . metoprolol succinate (TOPROL-XL) 100 MG 24 hr tablet Take 100 mg by mouth. Take 1.5 tablets once a day.    . metroNIDAZOLE (METROCREAM) 0.75 % cream Apply 1 application topically daily.     Marland Kitchen nystatin (MYCOSTATIN/NYSTOP) powder Apply topically 4 (four) times daily. 15 g 2  . PREVIDENT 5000 DRY MOUTH 1.1 % GEL dental gel BRUSH WITH PASTE 2 TO 3 TIMES PER DAY ** DO NOT RINSE **  5  . ranitidine (ZANTAC) 150 MG tablet TAKE ONE TABLET BY MOUTH TWICE DAILY     No current facility-administered  medications for this visit.     Review of Systems:  GENERAL:  "I have so much going on".  No fevers, sweats.  Weight down 9 pounds. PERFORMANCE STATUS (ECOG):  2 HEENT:  No visual changes, runny nose, sore throat, mouth sores or tenderness.  Sees dentist next week. Lungs: Interval admission for pneumonia.  No shortness of breath or cough.  No hemoptysis. Cardiac:  Interval decline in EF then recovery.  No chest pain, palpitations, orthopnea, or PND. GI:  Some diarrhea.  No nausea, vomiting, constipation, melena or hematochezia. GU:  Interval UTI diagnosed at [facility].  No urgency, frequency, dysuria, or hematuria. Musculoskeletal:  No back pain.  No joint pain.  No muscle tenderness. Extremities:  No pain or swelling. Skin:  No rashes or skin changes.  Neuro:  Mini CVA 05/2017.  No headache, numbness or weakness, balance or coordination issues. Endocrine:  Diabetes.  Thyroid disease on Synthroid.  No hot flashes or night sweats. Psych:  No mood changes, depression or anxiety. Pain:  No focal pain. Review of systems:  All other systems reviewed and found to be negative.   Physical Exam: Blood pressure 107/76, pulse 80, temperature (!) 97.3 F (36.3 C), temperature source Tympanic, resp. rate 18, weight 249 lb (112.9 kg). GENERAL:  Well developed, well nourished, woman sitting comfortably in a wheelchair in the exam room in no acute distress. MENTAL STATUS:  Alert and oriented to person, place and time. HEAD:  Short gray hair.  Normocephalic, atraumatic, face symmetric, no Cushingoid features. EYES:  Blue eyes.  Pupils equal round and reactive to light and accomodation.  No conjunctivitis or scleral icterus. ENT:  Oropharynx clear without lesion.  Tongue normal. Mucous membranes moist.  RESPIRATORY:  Clear to auscultation without rales, wheezes or rhonchi. CARDIOVASCULAR:  Regular rate and rhythm without murmur, rub or gallop. BREAST:  Right breast with post-operative changes.  No  masses, skin changes or nipple discharge.  Left breast with scarring under incision.  No dscrete masses, skin changes or nipple discharge.  No rash. ABDOMEN:  Soft, non-tender, with active bowel sounds, and no hepatosplenomegaly.  No masses. SKIN:  Rosacea.  No rashes, ulcers or lesions. EXTREMITIES: No edema, no skin discoloration or tenderness.  No palpable cords. LYMPH NODES: No palpable cervical, supraclavicular, axillary or inguinal adenopathy  NEUROLOGICAL: Unremarkable. PSYCH:  Appropriate.    Appointment on 04/22/2018  Component Date Value Ref Range Status  . Sodium 04/22/2018 137  135 - 145 mmol/L Final  . Potassium 04/22/2018 4.5  3.5 - 5.1 mmol/L Final  . Chloride 04/22/2018 95* 98 - 111 mmol/L Final  . CO2 04/22/2018 27  22 - 32 mmol/L Final  . Glucose, Bld 04/22/2018 237* 70 - 99 mg/dL Final  . BUN 04/22/2018 32* 8 - 23 mg/dL Final  . Creatinine, Ser 04/22/2018 1.61* 0.44 - 1.00 mg/dL Final  . Calcium 04/22/2018 10.1  8.9 - 10.3 mg/dL Final  . Total Protein 04/22/2018 7.1  6.5 - 8.1 g/dL Final  . Albumin 04/22/2018 3.6  3.5 - 5.0 g/dL Final  . AST 04/22/2018 26  15 - 41 U/L Final  . ALT 04/22/2018 17  0 - 44 U/L Final  . Alkaline Phosphatase 04/22/2018 78  38 - 126 U/L Final  . Total Bilirubin 04/22/2018 0.5  0.3 - 1.2 mg/dL Final  . GFR calc non Af Amer 04/22/2018 29* >60 mL/min Final  . GFR calc Af Amer 04/22/2018 33* >60 mL/min Final   Comment: (NOTE) The eGFR has been calculated using the CKD EPI equation. This calculation has not been validated in all clinical situations. eGFR's persistently <60 mL/min signify possible Chronic Kidney Disease.   . Anion gap 04/22/2018 15  5 - 15 Final   Performed at [facility], [address], [place], [place] [address]  . WBC 04/22/2018 9.0  4.0 - 10.5 K/uL Final  . RBC 04/22/2018 3.75* 3.87 - 5.11 MIL/uL Final  . Hemoglobin 04/22/2018 11.9* 12.0 - 15.0 g/dL Final  . HCT 04/22/2018 35.2* 36.0 - 46.0 % Final  .  MCV 04/22/2018 93.9  80.0 - 100.0 fL Final  . MCH 04/22/2018 31.7  26.0 - 34.0 pg Final  . MCHC 04/22/2018 33.8  30.0 - 36.0 g/dL Final  . RDW 04/22/2018 13.4  11.5 -  15.5 % Final  . Platelets 04/22/2018 240  150 - 400 K/uL Final  . nRBC 04/22/2018 0.0  0.0 - 0.2 % Final  . Neutrophils Relative % 04/22/2018 68  % Final  . Neutro Abs 04/22/2018 6.2  1.7 - 7.7 K/uL Final  . Lymphocytes Relative 04/22/2018 21  % Final  . Lymphs Abs 04/22/2018 1.9  0.7 - 4.0 K/uL Final  . Monocytes Relative 04/22/2018 6  % Final  . Monocytes Absolute 04/22/2018 0.6  0.1 - 1.0 K/uL Final  . Eosinophils Relative 04/22/2018 3  % Final  . Eosinophils Absolute 04/22/2018 0.2  0.0 - 0.5 K/uL Final  . Basophils Relative 04/22/2018 1  % Final  . Basophils Absolute 04/22/2018 0.1  0.0 - 0.1 K/uL Final  . Immature Granulocytes 04/22/2018 1  % Final  . Abs Immature Granulocytes 04/22/2018 0.05  0.00 - 0.07 K/uL Final   Performed at [facility], [address], [place], [place] [address]    Assessment:  Danielle Bennett is a 83 y.o. female with multi-focal right breast DCIS (1995) and stage IA Her2/neu + left breast cancer (2015).  She was diagnosed with multifocal DCIS of the right breast in 1995.  She underwent biopsy on 02/01/1994.  Pathology revealed multi-focal DCIS with comedo necrosis.  There was no evidence of lymphatic/vascular invasion.  Microcalcifications were present.  Margins were close (less than 1 mm).  She underwent re-excision on 03/01/1994 by Dr. Siri Cole at [facility].  There was no gross evidence of malignancy.  She received radiation.  She did not receive hormonal therapy.  She was diagnosed with Her2/neu + left breast cancer in 2015.  Stereotactic core biopsy on 04/05/2014 revealed invasive mammary carcinoma of no special type (1.1 mm) in a background of DCIS.  Wide excision of the mass in the left upper outer quadrant on 04/26/2014 revealed grade II DCIS, cribriform type with focal  lobular cytology.  There was no residual invasive carcinoma.  No tumor was seen in 2 lymph nodes.  Tumor was ER positive (> 90%), PR positive (90%), and Her2/neu 3+.  Pathologic stage was T1aN0(sn) M0.  She received adjuvant left whole breast radiation in 06/2014.  She started Arimidex in 07/2014.  Bilateral mammogram on 08/22/2016 revealed no evidence of malignancy.  Bilateral diagnostic mammogram on 09/18/2017 revealed a 1.2 cm group of indeterminate calcifications in the left breast adjacent to the lumpectomy site.  Left breast biopsy on 10/01/2017 revealed cytologic changes c/w prior radiation therapy.  There was scar-like fibrosis and foreign body giant cell reaction.  Microcalcifications were present.  There was no atypia or malignancy.  CA27.29 has been followed: 19.2 on 02/07/2016, 16.4 on 06/05/2016, 18.1 on 10/09/2016, 16.2 on 04/09/2017, 17.4 on 10/15/2017, and 15.9 on 04/22/2018.  Bone density on 06/27/2015 revealed osteoporosis with a T score of -3.2 in the left femoral neck, -2.5 in the hip and -2.4 in the left forearm.  She was previously on Fosamax, but discontinued for an unspecified period of time.  Bone density on 06/26/2017 revealed osteoporosis with a T-score of -3.7 in the left femoral neck.  She restarted Fosamax in 06/2015 (discontinued 2018).  She is on calcium and vitamin D.  She was admitted to [facility] from 11/12/2017 - 11/28/2017.  She developed acute hypoxic hypercarbic respiratory failure requiring intubation secondary to CAP. She was admitted to [facility] MICU and transferred to [facility] on 11/14/17 following extubation for continued treatment of her CAP vs. aspiration pneumonia. Following resolution of  PNA, she was found to have temporarily depressed LVEF (15-20%).  She required significant diuresis.   Repeat echo 11/26/2017 showed LVEF >70%, suggesting temporarily depressed EF due to tachyarrhythmia-induced cardiomyopathy secondary to hypervolemia and uncontrolled afib in  setting of acute respiratory illness.  Symptomatically, she denies any breast concerns.  Exam is stable.  Plan: 1.  Labs today:  CBC with diff, CMP, CA27.29. 2.  Stage IA Her2/neu + left breast cancer:  Schedule bilateral mammogram on 09/19/2018.  Continue Armidex. 3.  Osteoporosis:  Continue calcium and vitamin D.  Discuss consideration of Prolia.  Patient to discuss with dentist next week. 4.  RTC in 6 months for MD assessment and labs (CBC with diff, CMP, CA27.29).   Lequita Asal, MD  04/22/2018, 12:01 PM

## 2018-04-22 NOTE — Progress Notes (Signed)
Patient states she has yeast under her breasts. Patient offers no other concerns regarding her breasts.

## 2018-04-22 NOTE — Patient Instructions (Signed)

## 2018-04-23 LAB — CANCER ANTIGEN 27.29: CA 27.29: 15.9 U/mL (ref 0.0–38.6)

## 2018-07-01 ENCOUNTER — Other Ambulatory Visit: Payer: Self-pay | Admitting: Nephrology

## 2018-07-01 DIAGNOSIS — N183 Chronic kidney disease, stage 3 unspecified: Secondary | ICD-10-CM

## 2018-07-08 ENCOUNTER — Ambulatory Visit: Payer: Medicare Other

## 2018-07-13 ENCOUNTER — Encounter (INDEPENDENT_AMBULATORY_CARE_PROVIDER_SITE_OTHER): Payer: Self-pay

## 2018-07-13 ENCOUNTER — Ambulatory Visit
Admission: RE | Admit: 2018-07-13 | Discharge: 2018-07-13 | Disposition: A | Discharge status: Home / Self Care | Payer: Medicare Other | Source: Ambulatory Visit | Attending: Nephrology | Admitting: Nephrology

## 2018-07-13 DIAGNOSIS — N183 Chronic kidney disease, stage 3 unspecified: Secondary | ICD-10-CM

## 2018-07-23 ENCOUNTER — Encounter (INDEPENDENT_AMBULATORY_CARE_PROVIDER_SITE_OTHER): Payer: Medicare Other | Admitting: Vascular Surgery

## 2018-08-10 ENCOUNTER — Other Ambulatory Visit: Payer: Self-pay

## 2018-08-10 ENCOUNTER — Ambulatory Visit (INDEPENDENT_AMBULATORY_CARE_PROVIDER_SITE_OTHER): Payer: Medicare Other | Admitting: Vascular Surgery

## 2018-08-10 ENCOUNTER — Encounter (INDEPENDENT_AMBULATORY_CARE_PROVIDER_SITE_OTHER): Payer: Self-pay | Admitting: Vascular Surgery

## 2018-08-10 DIAGNOSIS — I10 Essential (primary) hypertension: Secondary | ICD-10-CM | POA: Insufficient documentation

## 2018-08-10 DIAGNOSIS — E119 Type 2 diabetes mellitus without complications: Secondary | ICD-10-CM | POA: Insufficient documentation

## 2018-08-10 DIAGNOSIS — E1169 Type 2 diabetes mellitus with other specified complication: Secondary | ICD-10-CM | POA: Diagnosis not present

## 2018-08-10 DIAGNOSIS — E782 Mixed hyperlipidemia: Secondary | ICD-10-CM

## 2018-08-10 DIAGNOSIS — E785 Hyperlipidemia, unspecified: Secondary | ICD-10-CM | POA: Insufficient documentation

## 2018-08-10 DIAGNOSIS — I890 Lymphedema, not elsewhere classified: Secondary | ICD-10-CM | POA: Insufficient documentation

## 2018-08-10 DIAGNOSIS — I872 Venous insufficiency (chronic) (peripheral): Secondary | ICD-10-CM | POA: Diagnosis not present

## 2018-08-10 DIAGNOSIS — Z794 Long term (current) use of insulin: Secondary | ICD-10-CM

## 2018-08-10 NOTE — Progress Notes (Signed)
MRN : [id number]  Danielle Bennett is a 84 y.o. (1934-07-08) female who presents with chief complaint of leg swelling.  History of Present Illness:   The patient returns to the office after several years at the request of Dr Holley Raring for followup evaluation regarding leg swelling.  The swelling has persisted and the pain associated with swelling continues. There have not been any interval development of a ulcerations or wounds.  She has not had any episodes of cellulitis  Since the previous visit the patient has not been wearing graduated compression stockings.  She has a lymph pump at home but does not use it regularly.   The patient also states she does use elevation during the day.   No outpatient medications have been marked as taking for the 08/10/18 encounter (Appointment) with Delana Meyer, Dolores Lory, MD.    Past Medical History:  Diagnosis Date  . Breast cancer ([place]) 1995   right breast cancer  . Breast cancer ([place]) 2015   left breast cancer  . Diabetes mellitus without complication ([place])   . GERD (gastroesophageal reflux disease)   . Thyroid disease     Past Surgical History:  Procedure Laterality Date  . BREAST BIOPSY Left 10/01/2017   affirm stereo/ path pending  . BREAST LUMPECTOMY Right 1995  . BREAST LUMPECTOMY Left 04/2014    Social History Social History   Tobacco Use  . Smoking status: Never Smoker  . Smokeless tobacco: Never Used  Substance Use Topics  . Alcohol use: No    Alcohol/week: 0.0 standard drinks  . Drug use: No    Family History Family History  Problem Relation Age of Onset  . Heart attack Mother   . Kidney cancer Brother   . Prostate cancer Brother   . Esophageal cancer Brother   . Breast cancer Neg Hx   No family history of bleeding/clotting disorders, porphyria or autoimmune disease   Allergies  Allergen Reactions  . Simvastatin     Other reaction(s): Other (See Comments) Myalgias     REVIEW OF SYSTEMS (Negative unless  checked)  Constitutional: [] Weight loss  [] Fever  [] Chills Cardiac: [] Chest pain   [] Chest pressure   [] Palpitations   [] Shortness of breath when laying flat   [x] Shortness of breath with exertion. Vascular:  [] Pain in legs with walking   [x] Pain in legs at rest  [] History of DVT   [] Phlebitis   [x] Swelling in legs   [] Varicose veins   [] Non-healing ulcers Pulmonary:   [] Uses home oxygen   [] Productive cough   [] Hemoptysis   [] Wheeze  [] COPD   [] Asthma Neurologic:  [] Dizziness   [] Seizures   [] History of stroke   [] History of TIA  [] Aphasia   [] Vissual changes   [] Weakness or numbness in arm   [] Weakness or numbness in leg Musculoskeletal:   [] Joint swelling   [x] Joint pain   [] Low back pain Hematologic:  [] Easy bruising  [] Easy bleeding   [] Hypercoagulable state   [] Anemic Gastrointestinal:  [] Diarrhea   [] Vomiting  [] Gastroesophageal reflux/heartburn   [] Difficulty swallowing. Genitourinary:  [x] Chronic kidney disease   [] Difficult urination  [] Frequent urination   [] Blood in urine Skin:  [] Rashes   [] Ulcers  Psychological:  [] History of anxiety   []  History of major depression.  Physical Examination  There were no vitals filed for this visit. There is no height or weight on file to calculate BMI. Gen: WD/WN, NAD Head: [place]/AT, No temporalis wasting.  Ear/Nose/Throat: Hearing grossly intact, nares w/o erythema or  drainage, poor dentition Eyes: PER, EOMI, sclera nonicteric.  Neck: Supple, no masses.  No bruit or JVD.  Pulmonary:  Good air movement, clear to auscultation bilaterally, no use of accessory muscles.  Cardiac: RRR, normal S1, S2, no Murmurs. Vascular: scattered varicosities present bilaterally.  Moderate right and severe left venous stasis changes to the legs.  3+ right and 4+ left hard non-pitting edema. Vessel Right Left  Radial Palpable Palpable  Gastrointestinal: soft, non-distended. No guarding/no peritoneal signs.  Musculoskeletal: M/S 5/5 throughout.  No deformity or  atrophy.  Neurologic: CN 2-12 intact. Pain and light touch intact in extremities.  Symmetrical.  Speech is fluent. Motor exam as listed above. Psychiatric: Judgment intact, Mood & affect appropriate for pt's clinical situation. Dermatologic: venous rashes no ulcers noted.  No changes consistent with cellulitis. Lymph : No Cervical lymphadenopathy, no lichenification or skin changes of chronic lymphedema.  CBC Lab Results  Component Value Date   WBC 9.0 04/22/2018   HGB 11.9 (L) 04/22/2018   HCT 35.2 (L) 04/22/2018   MCV 93.9 04/22/2018   PLT 240 04/22/2018    BMET    Component Value Date/Time   NA 137 04/22/2018 1046   NA 142 04/21/2014 1308   K 4.5 04/22/2018 1046   K 4.1 04/21/2014 1308   CL 95 (L) 04/22/2018 1046   CL 104 04/21/2014 1308   CO2 27 04/22/2018 1046   CO2 26 04/21/2014 1308   GLUCOSE 237 (H) 04/22/2018 1046   GLUCOSE 137 (H) 04/21/2014 1308   BUN 32 (H) 04/22/2018 1046   BUN 12 04/21/2014 1308   CREATININE 1.61 (H) 04/22/2018 1046   CREATININE 0.86 04/21/2014 1308   CALCIUM 10.1 04/22/2018 1046   CALCIUM 9.2 04/21/2014 1308   GFRNONAA 29 (L) 04/22/2018 1046   GFRNONAA >60 04/21/2014 1308   GFRNONAA 58 (L) 02/04/2014 1036   GFRAA 33 (L) 04/22/2018 1046   GFRAA >60 04/21/2014 1308   GFRAA >60 02/04/2014 1036   CrCl cannot be calculated (Patient's most recent lab result is older than the maximum 21 days allowed.).  COAG No results found for: INR, PROTIME  Radiology US Renal  Result Date: 07/13/2018 CLINICAL DATA:  Chronic kidney disease stage 3. EXAM: RENAL / URINARY TRACT ULTRASOUND COMPLETE COMPARISON:  None. FINDINGS: Right Kidney: Renal measurements: 10.7 x 4.3 x 4.2 cm = volume: 101 mL . Echogenicity within normal limits. No mass or hydronephrosis visualized. Left Kidney: Renal measurements: 10.2 x 5.0 x 4.5 cm = volume: 122 mL. Echogenicity within normal limits. No mass or hydronephrosis visualized. Bladder: Appears normal for degree of bladder  distention. IMPRESSION: Normal size and appearance of both kidneys. No evidence of hydronephrosis. Electronically Signed   By: Earle Gell M.D.   On: 07/13/2018 15:48     Assessment/Plan 1. Lymphedema  No surgery or intervention at this point in time.    I have reviewed my discussion with the patient regarding lymphedema and why it  causes symptoms.  Patient should begin compression wraps class 1 (20-30 mmHg) on a daily basis a prescription was given. The patient is reminded to put the stockings on first thing in the morning and removing them in the evening. The patient is instructed specifically not to sleep in the stockings.   In addition, behavioral modification throughout the day will be continued.  This will include frequent elevation (such as in a recliner), use of over the counter pain medications as needed and exercise such as walking.  I have reviewed systemic  causes for chronic edema such as liver, kidney and cardiac etiologies and there does not appear to be any significant changes in these organ systems over the past year.  The patient is under the impression that these organ systems are all stable and unchanged.    The patient should continue/begin aggressive use of the  lymph pump.  This will continue to improve the edema control and prevent sequela such as ulcers and infections.   The patient will follow-up with me on an annual basis.    2. Type 2 diabetes mellitus with other specified complication, with long-term current use of insulin (HCC) Continue hypoglycemic medications as already ordered, these medications have been reviewed and there are no changes at this time.  Hgb A1C to be monitored as already arranged by primary service   3. Essential hypertension Continue antihypertensive medications as already ordered, these medications have been reviewed and there are no changes at this time.   4. Mixed hyperlipidemia Continue statin as ordered and reviewed, no changes at  this time     Hortencia Pilar, MD  08/10/2018 12:37 PM

## 2018-10-21 ENCOUNTER — Ambulatory Visit: Payer: Medicare Other | Admitting: Hematology and Oncology

## 2018-10-21 ENCOUNTER — Other Ambulatory Visit: Payer: Medicare Other

## 2018-11-12 ENCOUNTER — Ambulatory Visit: Payer: Medicare Other | Admitting: Hematology and Oncology

## 2018-11-12 ENCOUNTER — Other Ambulatory Visit: Payer: Medicare Other

## 2018-12-21 NOTE — Progress Notes (Signed)
[facility]  [address] [place], [place] [address] Phone: [phone]  Fax: [phone]   Clinic Day:  12/22/2018  Referring physician: Sofie Hartigan, MD  Chief Complaint: Danielle Bennett is a 84 y.o. female with a history of multi-focal right breast DCIS (1995) and stage IA Her2/neu + left breast cancer (2015) who is seen for 8 month assessment.   HPI: The patient was last seen in the medical oncology clinic on 04/22/2018. At that time, she denied any breast concerns.  Exam was stable. Creatinine was 1.61.  CA27.29 was normal.  She was seen for leg swelling and recommended to wear compression wraps by Dr. Hortencia Pilar on 08/10/2018.  During the interim, the patient states "I'm doing fine". She reports having blood under her right arm a few weeks ago, it is now resolved. She had a bruise a while back that went from her left knee all the way up her side. She states that she bruises easily. She denies any lumps or changes.  She reports that she does monthly breast exams. She reports a rash under both breast, and is using bath cloth to obtain to the moisture. She is interested in a refill for the nystatin powder.   She is interested in a mammogram and BCI testing.   Past Medical History:  Diagnosis Date  . Breast cancer ([place]) 1995   right breast cancer  . Breast cancer ([place]) 2015   left breast cancer  . Diabetes mellitus without complication ([place])   . GERD (gastroesophageal reflux disease)   . Thyroid disease     Past Surgical History:  Procedure Laterality Date  . BREAST BIOPSY Left 10/01/2017   affirm stereo/ path pending  . BREAST LUMPECTOMY Right 1995  . BREAST LUMPECTOMY Left 04/2014    Family History  Problem Relation Age of Onset  . Heart attack Mother   . Kidney cancer Brother   . Prostate cancer Brother   . Esophageal cancer Brother   . Breast cancer Neg Hx     Social History:  reports that she has never  smoked. She has never used smokeless tobacco. She reports that she does not drink alcohol or use drugs.  She lives [place]. 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  She has family members who live on either side of her.The patient is alone today.  Allergies:  Allergies  Allergen Reactions  . Simvastatin     Other reaction(s): Other (See Comments) Myalgias    Current Medications: Current Outpatient Medications  Medication Sig Dispense Refill  . anastrozole (ARIMIDEX) 1 MG tablet TAKE 1 TABLET BY MOUTH ONCE DAILY 90 tablet 3  . apixaban (ELIQUIS) 5 MG TABS tablet Take 5 mg by mouth daily.     Marland Kitchen atorvastatin (LIPITOR) 40 MG tablet TAKE ONE TABLET BY MOUTH ONCE DAILY AT NIGHT TIME - MWF. and also takes a half tablet all other days.    . betamethasone valerate (VALISONE) 0.1 % cream Apply to skin as directed prn for itch    . Calcium Carb-Cholecalciferol (CALTRATE 600+D) 600-800 MG-UNIT TABS Take 2 tablets by mouth 2 (two) times daily.    . famotidine (PEPCID) 10 MG tablet Take 10 mg by mouth at bedtime.     . furosemide (LASIX) 20 MG tablet Take 40 mg by mouth 2 (two) times daily.     Marland Kitchen glipiZIDE (GLUCOTROL XL) 10 MG 24 hr tablet Take 10 mg by  mouth daily.     Marland Kitchen glucose blood test strip USE 3 (THREE) TIMES DAILY.    Marland Kitchen insulin lispro (HUMALOG) 100 UNIT/ML KwikPen 10 before breakfast, 12 units before lunch and dinner, plus sliding scale, max 60 units daily    . Insulin Pen Needle (B-D ULTRAFINE III SHORT PEN) 31G X 8 MM MISC USE 1  THREE TIMES DAILY AS DIRECTED    . levothyroxine (SYNTHROID, LEVOTHROID) 137 MCG tablet Take 137 mcg by mouth daily.     . Liraglutide (VICTOZA) 18 MG/3ML SOPN Inject 12 mg into the skin daily.     Marland Kitchen lisinopril (PRINIVIL,ZESTRIL) 20 MG tablet Take 20 mg by mouth daily.  3  . metoprolol succinate (TOPROL-XL) 100 MG 24 hr tablet Take 100 mg by mouth. Take 1.5 tablets once a day.    . metroNIDAZOLE (METROCREAM) 0.75 % cream  Apply 1 application topically daily.     . Microlet Lancets MISC USE TO CHECK GLUCOSE THREE TIMES DAILY AS DIRECTED    . nystatin (MYCOSTATIN/NYSTOP) powder Apply topically 4 (four) times daily. 15 g 2  . PREVIDENT 5000 DRY MOUTH 1.1 % GEL dental gel BRUSH WITH PASTE 2 TO 3 TIMES PER DAY ** DO NOT RINSE **  5  . TRESIBA FLEXTOUCH 100 UNIT/ML SOPN FlexTouch Pen Inject 35 Units into the skin daily.      No current facility-administered medications for this visit.     Review of Systems  Constitutional: Negative for chills, diaphoresis, fever and weight loss (up 24 lbs).       "I'm doing fine".  HENT: Negative for congestion, ear pain, hearing loss, nosebleeds and sore throat.   Eyes: Negative.  Negative for blurred vision and double vision.  Respiratory: Negative.  Negative for cough and shortness of breath.   Cardiovascular: Negative.  Negative for chest pain, palpitations and leg swelling.  Gastrointestinal: Negative.  Negative for abdominal pain, heartburn, melena, nausea and vomiting.  Genitourinary: Negative.  Negative for dysuria and urgency.  Musculoskeletal: Negative.  Negative for back pain, joint pain and myalgias.  Skin: Positive for rash (under breasts.).  Neurological: Negative for dizziness, tingling, sensory change, weakness and headaches.       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  Endo/Heme/Allergies: Bruises/bleeds easily.       Diabetes. Thyroid disease on Synthriod.   Psychiatric/Behavioral: Negative for depression and memory loss. The patient is not nervous/anxious and does not have insomnia.   All other systems reviewed and are negative.  Performance status (ECOG): 2  Vital Signs Blood pressure 110/68, pulse 78, temperature 98.5 F (36.9 C), temperature source Tympanic, resp. rate 20, weight 274 lb 4.8 oz (124.4 kg), SpO2 97 %.   Physical Exam  Constitutional: She is oriented to person, place, and time. She appears well-developed and well-nourished. No distress.  Elderly woman  sitting comfortably in a wheelchair in the exam room with no acute distress.  HENT:  Head: Normocephalic and atraumatic.  Mouth/Throat: Oropharynx is clear and moist. No oropharyngeal exudate.  Short gray hair. Mask.  Eyes: Pupils are equal, round, and reactive to light. Conjunctivae and EOM are normal. No scleral icterus.  Blue eyes.  Neck: Normal range of motion. Neck supple. No JVD present.  Cardiovascular: Normal rate, regular rhythm and normal heart sounds. Exam reveals no gallop.  No murmur heard. Pulmonary/Chest: Effort normal and breath sounds normal. No respiratory distress. Right breast exhibits skin change (significant moist Candida rash under breast). Left breast exhibits skin change (minor Candida rash under breast).  Right breast 12 o'clock 3 cm scar tissue.  Abdominal: Soft. Bowel sounds are normal. She exhibits no mass. There is no abdominal tenderness. There is no rebound and no guarding.  Musculoskeletal: Normal range of motion.        General: No edema.     Comments: Bruise under right arm, improving.  Lymphadenopathy:    She has no cervical adenopathy.    She has no axillary adenopathy.       Right: No supraclavicular adenopathy present.       Left: No supraclavicular adenopathy present.  Neurological: She is alert and oriented to person, place, and time. She has normal reflexes.  Skin: Skin is warm and dry. She is not diaphoretic.  Psychiatric: She has a normal mood and affect. Her behavior is normal. Judgment and thought content normal.  Nursing note and vitals reviewed.   Appointment on 12/22/2018  Component Date Value Ref Range Status  . WBC 12/22/2018 9.5  4.0 - 10.5 K/uL Final  . RBC 12/22/2018 3.70* 3.87 - 5.11 MIL/uL Final  . Hemoglobin 12/22/2018 11.8* 12.0 - 15.0 g/dL Final  . HCT 12/22/2018 35.6* 36.0 - 46.0 % Final  . MCV 12/22/2018 96.2  80.0 - 100.0 fL Final  . MCH 12/22/2018 31.9  26.0 - 34.0 pg Final  . MCHC 12/22/2018 33.1  30.0 - 36.0 g/dL  Final  . RDW 12/22/2018 12.9  11.5 - 15.5 % Final  . Platelets 12/22/2018 265  150 - 400 K/uL Final  . nRBC 12/22/2018 0.0  0.0 - 0.2 % Final  . Neutrophils Relative % 12/22/2018 77  % Final  . Neutro Abs 12/22/2018 7.3  1.7 - 7.7 K/uL Final  . Lymphocytes Relative 12/22/2018 15  % Final  . Lymphs Abs 12/22/2018 1.4  0.7 - 4.0 K/uL Final  . Monocytes Relative 12/22/2018 6  % Final  . Monocytes Absolute 12/22/2018 0.6  0.1 - 1.0 K/uL Final  . Eosinophils Relative 12/22/2018 1  % Final  . Eosinophils Absolute 12/22/2018 0.1  0.0 - 0.5 K/uL Final  . Basophils Relative 12/22/2018 1  % Final  . Basophils Absolute 12/22/2018 0.1  0.0 - 0.1 K/uL Final  . Immature Granulocytes 12/22/2018 0  % Final  . Abs Immature Granulocytes 12/22/2018 0.03  0.00 - 0.07 K/uL Final   Performed at [facility], [address], [place], [place] [address]  . Sodium 12/22/2018 135  135 - 145 mmol/L Final  . Potassium 12/22/2018 4.7  3.5 - 5.1 mmol/L Final  . Chloride 12/22/2018 98  98 - 111 mmol/L Final  . CO2 12/22/2018 26  22 - 32 mmol/L Final  . Glucose, Bld 12/22/2018 267* 70 - 99 mg/dL Final  . BUN 12/22/2018 45* 8 - 23 mg/dL Final  . Creatinine, Ser 12/22/2018 2.36* 0.44 - 1.00 mg/dL Final  . Calcium 12/22/2018 9.3  8.9 - 10.3 mg/dL Final  . Total Protein 12/22/2018 7.0  6.5 - 8.1 g/dL Final  . Albumin 12/22/2018 3.1* 3.5 - 5.0 g/dL Final  . AST 12/22/2018 19  15 - 41 U/L Final  . ALT 12/22/2018 16  0 - 44 U/L Final  . Alkaline Phosphatase 12/22/2018 77  38 - 126 U/L Final  . Total Bilirubin 12/22/2018 0.2* 0.3 - 1.2 mg/dL Final  . GFR calc non Af Amer 12/22/2018 19* >60 mL/min Final  . GFR calc Af Amer 12/22/2018 22* >60 mL/min Final  . Anion gap 12/22/2018 11  5 - 15 Final  Performed at [facility], [address], [place], [place] [address]    Assessment:  Danielle Bennett is a 84 y.o. female with multi-focal right breast DCIS (1995) and stage IA Her2/neu + left  breast cancer (2015).  She was diagnosed with multifocal DCIS of the right breast in 1995.  She underwent biopsy on 02/01/1994.  Pathology revealed multi-focal DCIS with comedo necrosis.  There was no evidence of lymphatic/vascular invasion.  Microcalcifications were present.  Margins were close (less than 1 mm).  She underwent re-excision on 03/01/1994 by Dr. Siri Cole at [facility].  There was no gross evidence of malignancy.  She received radiation.  She did not receive hormonal therapy.  She was diagnosed with Her2/neu + left breast cancer in 2015.  Stereotactic core biopsy on 04/05/2014 revealed invasive mammary carcinoma of no special type (1.1 mm) in a background of DCIS.  Wide excision of the mass in the left upper outer quadrant on 04/26/2014 revealed grade II DCIS, cribriform type with focal lobular cytology.  There was no residual invasive carcinoma.  No tumor was seen in 2 lymph nodes.  Tumor was ER positive (> 90%), PR positive (90%), and Her2/neu 3+.  Pathologic stage was T1aN0(sn) M0.  She received adjuvant left whole breast radiation in 06/2014.  She started Arimidex in 07/2014.  Bilateral mammogram on 08/22/2016 revealed no evidence of malignancy.  Bilateral diagnostic mammogram on 09/18/2017 revealed a 1.2 cm group of indeterminate calcifications in the left breast adjacent to the lumpectomy site.  Left breast biopsy on 10/01/2017 revealed cytologic changes c/w prior radiation therapy.  There was scar-like fibrosis and foreign body giant cell reaction.  Microcalcifications were present.  There was no atypia or malignancy.  CA27.29 has been followed: 19.2 on 02/07/2016, 16.4 on 06/05/2016, 18.1 on 10/09/2016, 16.2 on 04/09/2017, 17.4 on 10/15/2017, 15.9 on 04/22/2018, and 20.4 on 12/22/2018.  Bone density on 06/27/2015 revealed osteoporosis with a T score of -3.2 in the left femoral neck, -2.5 in the hip and -2.4 in the left forearm.  She was previously on Fosamax, but discontinued  for an unspecified period of time.  Bone density on 06/26/2017 revealed osteoporosis with a T-score of -3.7 in the left femoral neck.  She restarted Fosamax in 06/2015 (discontinued 2018).  She is on calcium and vitamin D.  She was admitted to [facility] from 11/12/2017 - 11/28/2017.  She developed acute hypoxic hypercarbic respiratory failure requiring intubation secondary to CAP. She was admitted to [facility] MICU and transferred to [facility] on 11/14/17 following extubation for continued treatment of her CAP vs. aspiration pneumonia. Following resolution of PNA, she was found to have temporarily depressed LVEF (15-20%).  She required significant diuresis.   Repeat echo 11/26/2017 showed LVEF >70%, suggesting temporarily depressed EF due to tachyarrhythmia-induced cardiomyopathy secondary to hypervolemia and uncontrolled afib in setting of acute respiratory illness.  Symptomatically, she is doing well.  She has a Candidal rash under her breasts (right > left).  Plan: 1.   Labs today:  CBC with diff, CMP, CA27.29. 2.   Stage IA Her2/neu + left breast cancer             Clinically she is doing well.             Schedule mammogram.    Continues Arimidex.  Discuss BCI testing guarding benefit of extended adjuvant endocrine therapy.  Patient interested. 3.   Osteoporosis             Continue  calcium and vitamin D.             Prolia being considered after discussion with dentist. 4.   Candidal rash  Patient has bilateral  Candidal rash under her breasts.  Rx: Nystatin powder 5.   RTC in 6 months for MD assessment and labs (CBC with diff, CMP, CA27.29).  I discussed the assessment and treatment plan with the patient.  The patient was provided an opportunity to ask questions and all were answered.  The patient agreed with the plan and demonstrated an understanding of the instructions.  The patient was advised to call back if the symptoms worsen or if the condition fails to improve as anticipated.    Lequita Asal, MD, PhD    12/22/2018, 11:57 AM  I, Selena Batten, am acting as scribe for [employer]. Mike Gip, MD, PhD.  I, Melissa C. Mike Gip, MD, have reviewed the above documentation for accuracy and completeness, and I agree with the above.

## 2018-12-22 ENCOUNTER — Other Ambulatory Visit: Payer: Self-pay

## 2018-12-22 ENCOUNTER — Inpatient Hospital Stay (HOSPITAL_BASED_OUTPATIENT_CLINIC_OR_DEPARTMENT_OTHER): Payer: Medicare Other | Admitting: Hematology and Oncology

## 2018-12-22 ENCOUNTER — Encounter: Payer: Self-pay | Admitting: Hematology and Oncology

## 2018-12-22 ENCOUNTER — Inpatient Hospital Stay: Payer: Medicare Other | Attending: Hematology and Oncology

## 2018-12-22 ENCOUNTER — Telehealth: Payer: Self-pay

## 2018-12-22 VITALS — BP 110/68 | HR 78 | Temp 98.5°F | Resp 20 | Wt 274.3 lb

## 2018-12-22 DIAGNOSIS — D0511 Intraductal carcinoma in situ of right breast: Secondary | ICD-10-CM

## 2018-12-22 DIAGNOSIS — Z923 Personal history of irradiation: Secondary | ICD-10-CM | POA: Insufficient documentation

## 2018-12-22 DIAGNOSIS — Z79811 Long term (current) use of aromatase inhibitors: Secondary | ICD-10-CM | POA: Insufficient documentation

## 2018-12-22 DIAGNOSIS — C50412 Malignant neoplasm of upper-outer quadrant of left female breast: Secondary | ICD-10-CM | POA: Diagnosis not present

## 2018-12-22 DIAGNOSIS — B372 Candidiasis of skin and nail: Secondary | ICD-10-CM | POA: Diagnosis not present

## 2018-12-22 DIAGNOSIS — M810 Age-related osteoporosis without current pathological fracture: Secondary | ICD-10-CM | POA: Insufficient documentation

## 2018-12-22 DIAGNOSIS — Z170 Estrogen receptor positive status [ER+]: Secondary | ICD-10-CM

## 2018-12-22 DIAGNOSIS — Z853 Personal history of malignant neoplasm of breast: Secondary | ICD-10-CM | POA: Insufficient documentation

## 2018-12-22 LAB — CBC WITH DIFFERENTIAL/PLATELET
Abs Immature Granulocytes: 0.03 10*3/uL (ref 0.00–0.07)
Basophils Absolute: 0.1 10*3/uL (ref 0.0–0.1)
Basophils Relative: 1 %
Eosinophils Absolute: 0.1 10*3/uL (ref 0.0–0.5)
Eosinophils Relative: 1 %
HCT: 35.6 % — ABNORMAL LOW (ref 36.0–46.0)
Hemoglobin: 11.8 g/dL — ABNORMAL LOW (ref 12.0–15.0)
Immature Granulocytes: 0 %
Lymphocytes Relative: 15 %
Lymphs Abs: 1.4 10*3/uL (ref 0.7–4.0)
MCH: 31.9 pg (ref 26.0–34.0)
MCHC: 33.1 g/dL (ref 30.0–36.0)
MCV: 96.2 fL (ref 80.0–100.0)
Monocytes Absolute: 0.6 10*3/uL (ref 0.1–1.0)
Monocytes Relative: 6 %
Neutro Abs: 7.3 10*3/uL (ref 1.7–7.7)
Neutrophils Relative %: 77 %
Platelets: 265 10*3/uL (ref 150–400)
RBC: 3.7 MIL/uL — ABNORMAL LOW (ref 3.87–5.11)
RDW: 12.9 % (ref 11.5–15.5)
WBC: 9.5 10*3/uL (ref 4.0–10.5)
nRBC: 0 % (ref 0.0–0.2)

## 2018-12-22 LAB — COMPREHENSIVE METABOLIC PANEL
ALT: 16 U/L (ref 0–44)
AST: 19 U/L (ref 15–41)
Albumin: 3.1 g/dL — ABNORMAL LOW (ref 3.5–5.0)
Alkaline Phosphatase: 77 U/L (ref 38–126)
Anion gap: 11 (ref 5–15)
BUN: 45 mg/dL — ABNORMAL HIGH (ref 8–23)
CO2: 26 mmol/L (ref 22–32)
Calcium: 9.3 mg/dL (ref 8.9–10.3)
Chloride: 98 mmol/L (ref 98–111)
Creatinine, Ser: 2.36 mg/dL — ABNORMAL HIGH (ref 0.44–1.00)
GFR calc Af Amer: 22 mL/min — ABNORMAL LOW (ref >60)
GFR calc non Af Amer: 19 mL/min — ABNORMAL LOW (ref >60)
Glucose, Bld: 267 mg/dL — ABNORMAL HIGH (ref 70–99)
Potassium: 4.7 mmol/L (ref 3.5–5.1)
Sodium: 135 mmol/L (ref 135–145)
Total Bilirubin: 0.2 mg/dL — ABNORMAL LOW (ref 0.3–1.2)
Total Protein: 7 g/dL (ref 6.5–8.1)

## 2018-12-22 MED ORDER — NYSTATIN 100000 UNIT/GM EX POWD: Freq: Four times a day (QID) | CUTANEOUS | 5 refills | Status: DC

## 2018-12-22 NOTE — Progress Notes (Signed)
Pt here for follow up. Denies any concerns.  

## 2018-12-22 NOTE — Telephone Encounter (Signed)
Unable to leave a message / labs has been forward to PCP office, i have tryed calling the patient no answer/ will try later in the week to inform her about her lab results.

## 2018-12-22 NOTE — Telephone Encounter (Signed)
-----   Message from Lequita Asal, MD sent at 12/22/2018  2:45 PM EDT ----- Regarding: Please call patient and send labs to PCP  Kidney function has decreased significantly.  Needs evaluation.  M ----- Message ----- From: Buel Ream, Lab [facility] [place] Sent: 12/22/2018  11:06 AM EDT To: Lequita Asal, MD

## 2018-12-24 ENCOUNTER — Telehealth: Payer: Self-pay

## 2018-12-24 LAB — CA 27.29 (SERIAL MONITOR): CA 27.29: 20.4 U/mL (ref 0.0–38.6)

## 2018-12-24 NOTE — Telephone Encounter (Signed)
Lab results have been routed to the PCP due to creat increase.

## 2018-12-28 ENCOUNTER — Telehealth: Payer: Self-pay

## 2018-12-28 NOTE — Telephone Encounter (Signed)
spoke with the patient to inform her of her lab results/ The patient has informed me that she will be seeing Dr. Milana Huntsman tomorrow for a visit. The patient was understand and agreeable to keep schedule appointment.

## 2019-04-19 ENCOUNTER — Ambulatory Visit
Admission: RE | Admit: 2019-04-19 | Discharge: 2019-04-19 | Disposition: A | Discharge status: Home / Self Care | Payer: Medicare Other | Source: Ambulatory Visit | Attending: Hematology and Oncology | Admitting: Hematology and Oncology

## 2019-04-19 DIAGNOSIS — Z170 Estrogen receptor positive status [ER+]: Secondary | ICD-10-CM | POA: Diagnosis present

## 2019-04-19 DIAGNOSIS — C50412 Malignant neoplasm of upper-outer quadrant of left female breast: Secondary | ICD-10-CM | POA: Diagnosis not present

## 2019-05-16 ENCOUNTER — Other Ambulatory Visit: Payer: Self-pay | Admitting: Hematology and Oncology

## 2019-05-16 DIAGNOSIS — C50412 Malignant neoplasm of upper-outer quadrant of left female breast: Secondary | ICD-10-CM

## 2019-05-16 DIAGNOSIS — Z170 Estrogen receptor positive status [ER+]: Secondary | ICD-10-CM

## 2019-06-22 ENCOUNTER — Other Ambulatory Visit: Payer: Medicare Other

## 2019-06-22 ENCOUNTER — Ambulatory Visit: Payer: Medicare Other | Admitting: Hematology and Oncology

## 2019-07-27 ENCOUNTER — Inpatient Hospital Stay: Payer: Medicare Other | Admitting: Hematology and Oncology

## 2019-07-27 ENCOUNTER — Inpatient Hospital Stay: Payer: Medicare Other

## 2019-08-23 ENCOUNTER — Inpatient Hospital Stay: Payer: Medicare Other | Attending: Hematology and Oncology

## 2019-08-23 ENCOUNTER — Inpatient Hospital Stay: Payer: Medicare Other | Admitting: Hematology and Oncology

## 2019-09-14 ENCOUNTER — Other Ambulatory Visit: Payer: Self-pay

## 2019-09-14 NOTE — Progress Notes (Signed)
Confirmed Name and DOB. Patient states she has completed her Arimidex for approx. 2 weeks. Patient would like to discuss stopping medication due to taking it for 5 years. Denies any other concerns.

## 2019-09-14 NOTE — Progress Notes (Signed)
[facility]  [address], [address] [place], [place] [address] Phone: [phone]  Fax: [phone]   Clinic Day:  09/15/2019  Referring physician: Sofie Hartigan, MD  Chief Complaint: Danielle Bennett is a 85 y.o. female with a history of multi-focal right breast DCIS (1995) and stage IA Her2/neu + left breast cancer (2015) who is seen for 9 month assessment.   HPI: The patient was last seen in the medical oncology clinic on 12/22/2018.  At that time, she was doing well.  She had a Candidal rash under her breasts (right > left). Hematocrit was 35.6, hemoglobin 11.8, MCV 96.2, platelets 265,000, WBC 9,500, ANC 7,300.  CA 27.29 was 20.4.   Bilateral diagnostic mammogram on 04/19/2019 revealed no evidence of malignancy.  During the interim, she has been "doing pretty good."  She has been having difficulty with a blood vessel in her eye. She saw Dr. Malissa Hippo at there [facility] on 09/14/2019 for a follow up. She has not been checking her blood sugar properly, but that she plans on getting back into a routine.   She states that she had been doing monthly breast exams; she has had no breast concerns. She has been having some soreness under her arms, which she treats with neosporin. She utilizing Nystatin powder, particularly when it's hot.   She has been having some shortness of breath when she walks. She has not had any issues waking up in the middle of the night with shortness of breath. She occasionally sleeps in a chair as she has been having difficulty getting into bed.   She started Arimidex in 2016.  We discussed her thoughts about extended adjuvant endocrine therapy (5 vs 10 years).  She is considering stopping Arimidex as she is s/p 5 years of treatment.   Bone density on 06/26/2017 revealed osteoporosis with a T-score of -3.7 at the femur neck left.  I have discussed the potential for follow-up bone density and possibly starting back on  Fosamax.  She was originally on Fosamax, but discontinued it in 2018.    Past Medical History:  Diagnosis Date  . Breast cancer ([place]) 1995   right breast cancer  . Breast cancer ([place]) 2015   left breast cancer  . Diabetes mellitus without complication ([place])   . GERD (gastroesophageal reflux disease)   . Personal history of radiation therapy 1995  . Personal history of radiation therapy 2015  . Thyroid disease     Past Surgical History:  Procedure Laterality Date  . BREAST BIOPSY Left 10/01/2017   affirm stereo/ neg  . BREAST LUMPECTOMY Right 1995  . BREAST LUMPECTOMY Left 04/2014    Family History  Problem Relation Age of Onset  . Heart attack Mother   . Kidney cancer Brother   . Prostate cancer Brother   . Esophageal cancer Brother   . Breast cancer Neg Hx     Social History:  reports that she has never smoked. She has never used smokeless tobacco. She reports that she does not drink alcohol or use drugs.  She lives [place]. She is a retired Network engineer from [employer] She lives alone.  She has family members who live on either side of her. The patient is alone today.   Allergies:  Allergies  Allergen Reactions  . Simvastatin     Other reaction(s): Other (See Comments) Myalgias    Current Medications: Current Outpatient Medications  Medication Sig Dispense Refill  .  apixaban (ELIQUIS) 5 MG TABS tablet Take 2.5 mg by mouth daily.     Marland Kitchen atorvastatin (LIPITOR) 40 MG tablet TAKE ONE TABLET BY MOUTH ONCE DAILY AT NIGHT TIME - MWF. and also takes a half tablet all other days.    . betamethasone valerate (VALISONE) 0.1 % cream Apply to skin as directed prn for itch    . Calcium Carb-Cholecalciferol (CALTRATE 600+D) 600-800 MG-UNIT TABS Take 1 tablet by mouth 2 (two) times daily.     . famotidine (PEPCID) 10 MG tablet Take 10 mg by mouth at bedtime.     . furosemide (LASIX) 20 MG tablet Take 40 mg by mouth 2 (two) times daily.     Marland Kitchen glipiZIDE  (GLUCOTROL XL) 10 MG 24 hr tablet Take 10 mg by mouth daily.     Marland Kitchen glucose blood test strip USE 3 (THREE) TIMES DAILY.    Marland Kitchen insulin lispro (HUMALOG) 100 UNIT/ML KwikPen 10 before breakfast, 12 units before lunch and dinner, plus sliding scale, max 60 units daily    . Insulin Pen Needle (B-D ULTRAFINE III SHORT PEN) 31G X 8 MM MISC USE 1  THREE TIMES DAILY AS DIRECTED    . levothyroxine (SYNTHROID, LEVOTHROID) 137 MCG tablet Take 137 mcg by mouth daily.     . Liraglutide (VICTOZA) 18 MG/3ML SOPN Inject into the skin daily.     Marland Kitchen lisinopril (PRINIVIL,ZESTRIL) 20 MG tablet Take 10 mg by mouth daily.   3  . metoprolol succinate (TOPROL-XL) 100 MG 24 hr tablet Take 100 mg by mouth. Take 1.5 tablets once a day.    . metroNIDAZOLE (METROCREAM) 0.75 % cream Apply 1 application topically daily.     . Microlet Lancets MISC USE TO CHECK GLUCOSE THREE TIMES DAILY AS DIRECTED    . PREVIDENT 5000 DRY MOUTH 1.1 % GEL dental gel BRUSH WITH PASTE 2 TO 3 TIMES PER DAY ** DO NOT RINSE **  5  . TRESIBA FLEXTOUCH 100 UNIT/ML SOPN FlexTouch Pen Inject 35 Units into the skin daily.     Marland Kitchen anastrozole (ARIMIDEX) 1 MG tablet Take 1 tablet by mouth once daily (Patient not taking: Reported on 09/14/2019) 90 tablet 0  . nystatin (MYCOSTATIN/NYSTOP) powder Apply topically 4 (four) times daily. (Patient not taking: Reported on 09/14/2019) 30 g 5   No current facility-administered medications for this visit.    Review of Systems  Constitutional: Negative for chills, diaphoresis, fever, malaise/fatigue and weight loss (Up 2 lbs).       Feels "good".  HENT: Negative.  Negative for congestion, ear pain, nosebleeds, sinus pain and sore throat.   Eyes: Negative.  Negative for blurred vision and double vision.  Respiratory: Positive for shortness of breath (with walking). Negative for cough and sputum production.   Cardiovascular: Positive for orthopnea (sleep in a chair at times). Negative for chest pain, palpitations and leg  swelling.  Gastrointestinal: Negative.  Negative for abdominal pain, blood in stool, constipation, diarrhea, heartburn, melena, nausea and vomiting.  Genitourinary: Negative.  Negative for dysuria, frequency, hematuria and urgency.  Musculoskeletal: Negative.  Negative for back pain, joint pain and myalgias.  Skin: Positive for rash (under breasts). Negative for itching.  Neurological: Negative.  Negative for dizziness, tingling, sensory change, speech change, weakness and headaches.       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  Endo/Heme/Allergies: Bruises/bleeds easily.       Diabetes. Thyroid disease on Synthroid.   Psychiatric/Behavioral: Negative.  Negative for depression and memory loss. The patient is not  nervous/anxious and does not have insomnia.   All other systems reviewed and are negative.  Performance status (ECOG):  1-2  Wt Readings from Last 3 Encounters:  09/15/19 276 lb (125.2 kg)  12/22/18 274 lb 4.8 oz (124.4 kg)  08/10/18 250 lb (113.4 kg)    Vital Signs Blood pressure 113/62, pulse 71, temperature (!) 97.5 F (36.4 C), temperature source Tympanic, resp. rate 18, height '5\' 4"'$  (1.626 m), weight 276 lb (125.2 kg), SpO2 99 %.   Physical Exam  Constitutional: She is oriented to person, place, and time. She appears well-developed and well-nourished. No distress.  Elderly woman sitting comfortably in a wheelchair in the exam room in no acute distress.  HENT:  Head: Normocephalic and atraumatic.  Mouth/Throat: Oropharynx is clear and moist. No oropharyngeal exudate.  Curly Testa hair.  Mask.  Eyes: Pupils are equal, round, and reactive to light. Conjunctivae and EOM are normal. No scleral icterus.  Blue eyes.  Neck: No JVD present.  Cardiovascular: Normal rate, regular rhythm and normal heart sounds. Exam reveals no gallop.  No murmur heard. Pulmonary/Chest: Effort normal and breath sounds normal. No respiratory distress. She has no wheezes. She has no rales. Left breast exhibits no skin  change.  Right breast 12 o'clock 3 x 4.0 cm scar (old). Moist pinkness under breast c/w early fungal infection. Left breast with minor pinkness in breast fold.  Abdominal: Soft. Bowel sounds are normal. She exhibits no mass. There is no abdominal tenderness. There is no rebound and no guarding.  Musculoskeletal:        General: Edema (chronic 2+ bilateral changes) present. Normal range of motion.     Cervical back: Normal range of motion and neck supple.  Lymphadenopathy:       Head (right side): No preauricular, no posterior auricular and no occipital adenopathy present.       Head (left side): No preauricular, no posterior auricular and no occipital adenopathy present.    She has no cervical adenopathy.    She has no axillary adenopathy.       Right: No inguinal and no supraclavicular adenopathy present.       Left: No inguinal and no supraclavicular adenopathy present.  Axillary tenderness.  Neurological: She is alert and oriented to person, place, and time. She has normal reflexes.  Skin: Skin is warm and dry. Abrasion (scratches on back secondary to pruritus) and rash noted. She is not diaphoretic.  Psychiatric: She has a normal mood and affect. Her behavior is normal. Judgment and thought content normal.  Nursing note and vitals reviewed.   Appointment on 09/15/2019  Component Date Value Ref Range Status  . Sodium 09/15/2019 137  135 - 145 mmol/L Final  . Potassium 09/15/2019 5.0  3.5 - 5.1 mmol/L Final  . Chloride 09/15/2019 104  98 - 111 mmol/L Final  . CO2 09/15/2019 21* 22 - 32 mmol/L Final  . Glucose, Bld 09/15/2019 224* 70 - 99 mg/dL Final   Glucose reference range applies only to samples taken after fasting for at least 8 hours.  . BUN 09/15/2019 41* 8 - 23 mg/dL Final  . Creatinine, Ser 09/15/2019 1.86* 0.44 - 1.00 mg/dL Final  . Calcium 09/15/2019 9.0  8.9 - 10.3 mg/dL Final  . Total Protein 09/15/2019 6.8  6.5 - 8.1 g/dL Final  . Albumin 09/15/2019 3.3* 3.5 - 5.0 g/dL  Final  . AST 09/15/2019 20  15 - 41 U/L Final  . ALT 09/15/2019 19  0 -  44 U/L Final  . Alkaline Phosphatase 09/15/2019 61  38 - 126 U/L Final  . Total Bilirubin 09/15/2019 0.8  0.3 - 1.2 mg/dL Final  . GFR calc non Af Amer 09/15/2019 25* >60 mL/min Final  . GFR calc Af Amer 09/15/2019 29* >60 mL/min Final  . Anion gap 09/15/2019 12  5 - 15 Final   Performed at [facility], [address] [place], [place] [address]    Assessment:  Danielle Bennett is a 85 y.o. female with multi-focal right breast DCIS (1995) and stage IA Her2/neu + left breast cancer (2015).  She was diagnosed with multifocal DCIS of the right breast in 1995.  She underwent biopsy on 02/01/1994.  Pathology revealed multi-focal DCIS with comedo necrosis.  There was no evidence of lymphatic/vascular invasion.  Microcalcifications were present.  Margins were close (less than 1 mm).  She underwent re-excision on 03/01/1994 by Dr. Siri Cole at [facility].  There was no gross evidence of malignancy.  She received radiation.  She did not receive hormonal therapy.  She was diagnosed with Her2/neu + left breast cancer in 2015.  Stereotactic core biopsy on 04/05/2014 revealed invasive mammary carcinoma of no special type (1.1 mm) in a background of DCIS.  Wide excision of the mass in the left upper outer quadrant on 04/26/2014 revealed grade II DCIS, cribriform type with focal lobular cytology.  There was no residual invasive carcinoma.  No tumor was seen in 2 lymph nodes.  Tumor was ER positive (> 90%), PR positive (90%), and Her2/neu 3+.  Pathologic stage was T1aN0(sn) M0.  She received adjuvant left whole breast radiation in 06/2014.  She started Arimidex in 07/2014.  Bilateral mammogram on 08/22/2016 revealed no evidence of malignancy.  Bilateral diagnostic mammogram on 09/18/2017 revealed a 1.2 cm group of indeterminate calcifications in the left breast adjacent to the lumpectomy site.  Left breast biopsy on  10/01/2017 revealed cytologic changes c/w prior radiation therapy.  There was scar-like fibrosis and foreign body giant cell reaction.  Microcalcifications were present.  There was no atypia or malignancy.  Bilateral diagnostic mammogram on 04/19/2019 revealed no evidence of malignancy.  CA27.29 has been followed: 19.2 on 02/07/2016, 16.4 on 06/05/2016, 18.1 on 10/09/2016, 16.2 on 04/09/2017, 17.4 on 10/15/2017, 15.9 on 04/22/2018, and 20.4 on 12/22/2018.  Bone density on 06/27/2015 revealed osteoporosis with a T score of -3.2 in the left femoral neck, -2.5 in the hip and -2.4 in the left forearm.  She was previously on Fosamax, but discontinued for an unspecified period of time.  Bone density on 06/26/2017 revealed osteoporosis with a T-score of -3.7 in the left femoral neck.  She restarted Fosamax in 06/2015 (discontinued 2018).  She is on calcium and vitamin D.  She was admitted to [facility] from 11/12/2017 - 11/28/2017.  She developed acute hypoxic hypercarbic respiratory failure requiring intubation secondary to CAP. She was admitted to [facility] and transferred to [facility] on 11/14/17 following extubation for continued treatment of her CAP vs. aspiration pneumonia. Following resolution of PNA, she was found to have temporarily depressed LVEF (15-20%).  She required significant diuresis.   Repeat echo 11/26/2017 showed LVEF >70%, suggesting temporarily depressed EF due to tachyarrhythmia-induced cardiomyopathy secondary to hypervolemia and uncontrolled afib in setting of acute respiratory illness.  Symptomatically, she feels "good".  She denies any breast concerns.  Exam reveals moist changes under her breast (right > left).  Plan: 1.   Labs today:  CBC with diff, CMP, CA27.29.  2.   Stage IA Her2/neu + left breast cancer             Clinically, she is doing well.             Bilateral diagnostic mammogram on 04/19/2019 revealed no evidence of malignancy.   She has been on Arimidex since  07/2014.  Discuss high risk Her2/neu + breast cancer and consideration of extended adjuvant endocrine therapy.  Patient is considering discontinuing Arimidex.   Refill Arimidex today.   Discuss further at next visit. 3.   Osteoporosis             Bone density on 06/26/2017 revealed osteoporosis with a T-score of -3.7 in the left femoral neck.    Discuss rechecking bone density every 2 years (over due).  Continue calcium and vitamin D.  Consider Fosamax or Prolia.  Patient will require dental clearance. 4.   Candidal rash  Patient has moist areas under breasts and history of recurrent Candidal infections.  Refill Nystatin. 5.   Schedule bone density on 09/20/2019. 6.   RTC after bone density for MD assessment (telephone or video), review of bone density, and decision regarding length of endocrine therapy.  Addendum:  Review of notes from 10/15/2017 revealed that Fosamax was discontinued secondary to diarrhea.  At that time, we discussed use of Prolia.  Side effects were reviewed.  Dental clearance was discussed.    I discussed the assessment and treatment plan with the patient.  The patient was provided an opportunity to ask questions and all were answered.  The patient agreed with the plan and demonstrated an understanding of the instructions.  The patient was advised to call back if the symptoms worsen or if the condition fails to improve as anticipated.   Lequita Asal, MD, PhD    09/15/2019, 9:52 AM  I, Jacqualyn Posey, am acting as a Education administrator for [employer]. Mike Gip, MD.   I, Yomaira Solar C. Mike Gip, MD, have reviewed the above documentation for accuracy and completeness, and I agree with the above.

## 2019-09-15 ENCOUNTER — Inpatient Hospital Stay (HOSPITAL_BASED_OUTPATIENT_CLINIC_OR_DEPARTMENT_OTHER): Payer: Medicare Other | Admitting: Hematology and Oncology

## 2019-09-15 ENCOUNTER — Inpatient Hospital Stay: Payer: Medicare Other | Attending: Hematology and Oncology

## 2019-09-15 ENCOUNTER — Other Ambulatory Visit: Payer: Self-pay

## 2019-09-15 ENCOUNTER — Encounter: Payer: Self-pay | Admitting: Hematology and Oncology

## 2019-09-15 VITALS — BP 113/62 | HR 71 | Temp 97.5°F | Resp 18 | Ht 64.0 in | Wt 276.0 lb

## 2019-09-15 DIAGNOSIS — R21 Rash and other nonspecific skin eruption: Secondary | ICD-10-CM | POA: Insufficient documentation

## 2019-09-15 DIAGNOSIS — E119 Type 2 diabetes mellitus without complications: Secondary | ICD-10-CM | POA: Insufficient documentation

## 2019-09-15 DIAGNOSIS — Z8051 Family history of malignant neoplasm of kidney: Secondary | ICD-10-CM | POA: Insufficient documentation

## 2019-09-15 DIAGNOSIS — Z170 Estrogen receptor positive status [ER+]: Secondary | ICD-10-CM | POA: Insufficient documentation

## 2019-09-15 DIAGNOSIS — M810 Age-related osteoporosis without current pathological fracture: Secondary | ICD-10-CM

## 2019-09-15 DIAGNOSIS — R197 Diarrhea, unspecified: Secondary | ICD-10-CM | POA: Insufficient documentation

## 2019-09-15 DIAGNOSIS — R0601 Orthopnea: Secondary | ICD-10-CM | POA: Insufficient documentation

## 2019-09-15 DIAGNOSIS — J690 Pneumonitis due to inhalation of food and vomit: Secondary | ICD-10-CM | POA: Insufficient documentation

## 2019-09-15 DIAGNOSIS — Z853 Personal history of malignant neoplasm of breast: Secondary | ICD-10-CM | POA: Diagnosis not present

## 2019-09-15 DIAGNOSIS — C50412 Malignant neoplasm of upper-outer quadrant of left female breast: Secondary | ICD-10-CM | POA: Diagnosis present

## 2019-09-15 DIAGNOSIS — D0511 Intraductal carcinoma in situ of right breast: Secondary | ICD-10-CM

## 2019-09-15 DIAGNOSIS — Z8249 Family history of ischemic heart disease and other diseases of the circulatory system: Secondary | ICD-10-CM | POA: Diagnosis not present

## 2019-09-15 DIAGNOSIS — Z79811 Long term (current) use of aromatase inhibitors: Secondary | ICD-10-CM | POA: Diagnosis not present

## 2019-09-15 DIAGNOSIS — Z808 Family history of malignant neoplasm of other organs or systems: Secondary | ICD-10-CM | POA: Diagnosis not present

## 2019-09-15 DIAGNOSIS — J9602 Acute respiratory failure with hypercapnia: Secondary | ICD-10-CM | POA: Insufficient documentation

## 2019-09-15 DIAGNOSIS — Z923 Personal history of irradiation: Secondary | ICD-10-CM | POA: Diagnosis not present

## 2019-09-15 DIAGNOSIS — Z7901 Long term (current) use of anticoagulants: Secondary | ICD-10-CM | POA: Diagnosis not present

## 2019-09-15 DIAGNOSIS — Z8701 Personal history of pneumonia (recurrent): Secondary | ICD-10-CM | POA: Insufficient documentation

## 2019-09-15 DIAGNOSIS — E079 Disorder of thyroid, unspecified: Secondary | ICD-10-CM | POA: Insufficient documentation

## 2019-09-15 DIAGNOSIS — Z8042 Family history of malignant neoplasm of prostate: Secondary | ICD-10-CM | POA: Insufficient documentation

## 2019-09-15 DIAGNOSIS — B372 Candidiasis of skin and nail: Secondary | ICD-10-CM | POA: Insufficient documentation

## 2019-09-15 DIAGNOSIS — Z7189 Other specified counseling: Secondary | ICD-10-CM | POA: Diagnosis not present

## 2019-09-15 DIAGNOSIS — Z79899 Other long term (current) drug therapy: Secondary | ICD-10-CM | POA: Diagnosis not present

## 2019-09-15 LAB — COMPREHENSIVE METABOLIC PANEL
ALT: 19 U/L (ref 0–44)
AST: 20 U/L (ref 15–41)
Albumin: 3.3 g/dL — ABNORMAL LOW (ref 3.5–5.0)
Alkaline Phosphatase: 61 U/L (ref 38–126)
Anion gap: 12 (ref 5–15)
BUN: 41 mg/dL — ABNORMAL HIGH (ref 8–23)
CO2: 21 mmol/L — ABNORMAL LOW (ref 22–32)
Calcium: 9 mg/dL (ref 8.9–10.3)
Chloride: 104 mmol/L (ref 98–111)
Creatinine, Ser: 1.86 mg/dL — ABNORMAL HIGH (ref 0.44–1.00)
GFR calc Af Amer: 29 mL/min — ABNORMAL LOW (ref >60)
GFR calc non Af Amer: 25 mL/min — ABNORMAL LOW (ref >60)
Glucose, Bld: 224 mg/dL — ABNORMAL HIGH (ref 70–99)
Potassium: 5 mmol/L (ref 3.5–5.1)
Sodium: 137 mmol/L (ref 135–145)
Total Bilirubin: 0.8 mg/dL (ref 0.3–1.2)
Total Protein: 6.8 g/dL (ref 6.5–8.1)

## 2019-09-15 MED ORDER — ANASTROZOLE 1 MG PO TABS: 1.0000 mg | ORAL_TABLET | Freq: Every day | ORAL | 0 refills | Status: DC

## 2019-09-15 MED ORDER — NYSTATIN 100000 UNIT/GM EX POWD: Freq: Three times a day (TID) | CUTANEOUS | 5 refills | Status: AC

## 2019-09-15 NOTE — Telephone Encounter (Signed)
Error

## 2019-09-15 NOTE — Progress Notes (Signed)
No new changes noted today 

## 2019-09-16 LAB — CANCER ANTIGEN 27.29: CA 27.29: 16.5 U/mL (ref 0.0–38.6)

## 2019-09-20 ENCOUNTER — Other Ambulatory Visit: Payer: Self-pay

## 2019-09-20 ENCOUNTER — Ambulatory Visit
Admission: RE | Admit: 2019-09-20 | Discharge: 2019-09-20 | Disposition: A | Discharge status: Home / Self Care | Payer: Medicare Other | Source: Ambulatory Visit | Attending: Hematology and Oncology | Admitting: Hematology and Oncology

## 2019-09-20 DIAGNOSIS — M810 Age-related osteoporosis without current pathological fracture: Secondary | ICD-10-CM | POA: Insufficient documentation

## 2019-09-20 NOTE — Progress Notes (Signed)
[facility]  [address], [address] [place], [place] [address] Phone: [phone]  Fax: [phone]  Telemedicine Office Visit:  09/22/2019  Referring physician: Sofie Hartigan, MD  I connected with Danielle Bennett on 09/22/19 at 2:47 PM by telephone and verified that I was speaking with the correct person using 2 identifiers.  The patient was at  home.  I discussed the limitations, risk, security and privacy concerns of performing an evaluation and management service by telephone and the availability of in person appointments.  I also discussed with the patient that there may be a patient responsible charge related to this service.  The patient expressed understanding and agreed to proceed.   Chief Complaint: Danielle Bennett is a 85 y.o. female with a history of multi-focal right breast DCIS (1995) and stage IA Her2/neu + left breast cancer (2015) who is seen for review of interval bone density.   HPI: The patient was last seen in the medical oncology clinic on 09/15/2019. At that time, she felt "good".  She denied any breast concerns.  Exam revealed moist changes under her breast (right > left). CA 27.29 was 16.5.  She had completed 5 years of adjuvant endocrine therapy.  We discussed consideration of extended adjuvant therapy.  Decision was made to follow-up on her bone density prior to making a decision.  Bone density on 09/20/2019 revealed osteoporosis with a T-score of -3.5 at the left femoral neck.   During the interim, she says she has been doing well. She has been using Nystatin powder and it has been helpful. She feels confused regarding her treatment options. She states she is unsure if she wants to continue hormone therapy.    Past Medical History:  Diagnosis Date  . Breast cancer ([place]) 1995   right breast cancer  . Breast cancer ([place]) 2015   left breast cancer  . Diabetes mellitus without complication ([place])   . GERD  (gastroesophageal reflux disease)   . Personal history of radiation therapy 1995  . Personal history of radiation therapy 2015  . Thyroid disease     Past Surgical History:  Procedure Laterality Date  . BREAST BIOPSY Left 10/01/2017   affirm stereo/ neg  . BREAST LUMPECTOMY Right 1995  . BREAST LUMPECTOMY Left 04/2014    Family History  Problem Relation Age of Onset  . Heart attack Mother   . Kidney cancer Brother   . Prostate cancer Brother   . Esophageal cancer Brother   . Breast cancer Neg Hx     Social History:  reports that she has never smoked. She has never used smokeless tobacco. She reports that she does not drink alcohol or use drugs.  She lives [place]. She is a retired Network engineer from [employer] She lives alone.  She has family members who live on either side of her. The patient is alone  today.  Participants in the patient's visit and their role in the encounter included the patient, [employer], [place] [employer], [place], [place], today. The intake visit was provided by Vito Berger, CMA.   Allergies:  Allergies  Allergen Reactions  . Simvastatin     Other reaction(s): Other (See Comments) Myalgias    Current Medications: Current Outpatient Medications  Medication Sig Dispense Refill  . anastrozole (ARIMIDEX) 1 MG tablet Take 1 tablet (1 mg total) by mouth daily. 90 tablet 0  . apixaban (ELIQUIS) 5 MG TABS tablet Take 2.5 mg by mouth  daily.     . atorvastatin (LIPITOR) 40 MG tablet TAKE ONE TABLET BY MOUTH ONCE DAILY AT NIGHT TIME - MWF. and also takes a half tablet all other days.    . betamethasone valerate (VALISONE) 0.1 % cream Apply to skin as directed prn for itch    . Calcium Carb-Cholecalciferol (CALTRATE 600+D) 600-800 MG-UNIT TABS Take 1 tablet by mouth 2 (two) times daily.     . famotidine (PEPCID) 10 MG tablet Take 10 mg by mouth at bedtime.     . furosemide (LASIX) 20 MG tablet Take 40 mg by mouth 2 (two) times  daily.     Marland Kitchen glipiZIDE (GLUCOTROL XL) 10 MG 24 hr tablet Take 10 mg by mouth daily.     Marland Kitchen glucose blood test strip USE 3 (THREE) TIMES DAILY.    Marland Kitchen insulin lispro (HUMALOG) 100 UNIT/ML KwikPen 10 before breakfast, 12 units before lunch and dinner, plus sliding scale, max 60 units daily    . Insulin Pen Needle (B-D ULTRAFINE III SHORT PEN) 31G X 8 MM MISC USE 1  THREE TIMES DAILY AS DIRECTED    . levothyroxine (SYNTHROID, LEVOTHROID) 137 MCG tablet Take 137 mcg by mouth daily.     . Liraglutide (VICTOZA) 18 MG/3ML SOPN Inject into the skin daily.     Marland Kitchen lisinopril (PRINIVIL,ZESTRIL) 20 MG tablet Take 10 mg by mouth daily.   3  . metoprolol succinate (TOPROL-XL) 100 MG 24 hr tablet Take 100 mg by mouth. Take 1.5 tablets once a day.    . metroNIDAZOLE (METROCREAM) 0.75 % cream Apply 1 application topically daily.     . Microlet Lancets MISC USE TO CHECK GLUCOSE THREE TIMES DAILY AS DIRECTED    . nystatin (MYCOSTATIN/NYSTOP) powder Apply topically 3 (three) times daily. 30 g 5  . PREVIDENT 5000 DRY MOUTH 1.1 % GEL dental gel BRUSH WITH PASTE 2 TO 3 TIMES PER DAY ** DO NOT RINSE **  5  . TRESIBA FLEXTOUCH 100 UNIT/ML SOPN FlexTouch Pen Inject 35 Units into the skin daily.      No current facility-administered medications for this visit.    Review of Systems  Constitutional: Negative for chills, diaphoresis, fever, malaise/fatigue and weight loss.       Feels "good".  HENT: Negative.  Negative for congestion, ear pain, nosebleeds, sinus pain and sore throat.   Eyes: Negative.  Negative for blurred vision and double vision.  Respiratory: Positive for shortness of breath (with walking). Negative for cough and sputum production.   Cardiovascular: Positive for orthopnea (sleep in a chair at times). Negative for chest pain, palpitations and leg swelling.  Gastrointestinal: Negative for abdominal pain, blood in stool, constipation, diarrhea, heartburn, melena, nausea and vomiting.  Genitourinary:  Negative.  Negative for dysuria, frequency, hematuria and urgency.  Musculoskeletal: Negative.  Negative for back pain, joint pain and myalgias.  Skin: Positive for rash (under breasts). Negative for itching.  Neurological: Negative.  Negative for dizziness, tingling, sensory change, speech change, weakness and headaches.       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  Endo/Heme/Allergies: Does not bruise/bleed easily.       Diabetes. Thyroid disease on Synthroid.   Psychiatric/Behavioral: Negative.  Negative for depression and memory loss. The patient is not nervous/anxious and does not have insomnia.   All other systems reviewed and are negative.  Performance status (ECOG):  1  Wt Readings from Last 3 Encounters:  09/15/19 276 lb (125.2 kg)  12/22/18 274 lb 4.8 oz (124.4 kg)  08/10/18 250 lb (113.4 kg)    Vital Signs There were no vitals taken for this visit.   Physical Exam    No visits with results within 3 Day(s) from this visit.  Latest known visit with results is:  Appointment on 09/15/2019  Component Date Value Ref Range Status  . CA 27.29 09/15/2019 16.5  0.0 - 38.6 U/mL Final   Comment: (NOTE) Siemens Centaur Immunochemiluminometric Methodology [facility]) Values obtained with different assay methods or kits cannot be used interchangeably. Results cannot be interpreted as absolute evidence of the presence or absence of malignant disease. Performed At: [facility] [place], [place] [id number] Rush Farmer MD [id number]   . Sodium 09/15/2019 137  135 - 145 mmol/L Final  . Potassium 09/15/2019 5.0  3.5 - 5.1 mmol/L Final  . Chloride 09/15/2019 104  98 - 111 mmol/L Final  . CO2 09/15/2019 21* 22 - 32 mmol/L Final  . Glucose, Bld 09/15/2019 224* 70 - 99 mg/dL Final   Glucose reference range applies only to samples taken after fasting for at least 8 hours.  . BUN 09/15/2019 41* 8 - 23 mg/dL Final  . Creatinine, Ser 09/15/2019 1.86* 0.44 - 1.00 mg/dL Final  . Calcium  09/15/2019 9.0  8.9 - 10.3 mg/dL Final  . Total Protein 09/15/2019 6.8  6.5 - 8.1 g/dL Final  . Albumin 09/15/2019 3.3* 3.5 - 5.0 g/dL Final  . AST 09/15/2019 20  15 - 41 U/L Final  . ALT 09/15/2019 19  0 - 44 U/L Final  . Alkaline Phosphatase 09/15/2019 61  38 - 126 U/L Final  . Total Bilirubin 09/15/2019 0.8  0.3 - 1.2 mg/dL Final  . GFR calc non Af Amer 09/15/2019 25* >60 mL/min Final  . GFR calc Af Amer 09/15/2019 29* >60 mL/min Final  . Anion gap 09/15/2019 12  5 - 15 Final   Performed at [facility], [address] [place], [place] [address]    Assessment:  Danielle Bennett is a 85 y.o. female with multi-focal right breast DCIS (1995) and stage IA Her2/neu + left breast cancer (2015).  She was diagnosed with multifocal DCIS of the right breast in 1995.  She underwent biopsy on 02/01/1994.  Pathology revealed multi-focal DCIS with comedo necrosis.  There was no evidence of lymphatic/vascular invasion.  Microcalcifications were present.  Margins were close (less than 1 mm).  She underwent re-excision on 03/01/1994 by Dr. Siri Cole at [facility].  There was no gross evidence of malignancy.  She received radiation.  She did not receive hormonal therapy.  She was diagnosed with Her2/neu + left breast cancer in 2015.  Stereotactic core biopsy on 04/05/2014 revealed invasive mammary carcinoma of no special type (1.1 mm) in a background of DCIS.  Wide excision of the mass in the left upper outer quadrant on 04/26/2014 revealed grade II DCIS, cribriform type with focal lobular cytology.  There was no residual invasive carcinoma.  No tumor was seen in 2 lymph nodes.  Tumor was ER positive (> 90%), PR positive (90%), and Her2/neu 3+.  Pathologic stage was T1aN0(sn) M0.  She received adjuvant left whole breast radiation in 06/2014.  She started Arimidex in 07/2014.  Bilateral mammogram on 08/22/2016 revealed no evidence of malignancy.  Bilateral diagnostic mammogram on 09/18/2017  revealed a 1.2 cm group of indeterminate calcifications in the left breast adjacent to the lumpectomy site.  Left breast biopsy on 10/01/2017 revealed cytologic changes c/w prior radiation therapy.  There was scar-like fibrosis and foreign body giant cell reaction.  Microcalcifications were present.  There was no atypia or malignancy.  Bilateral diagnostic mammogram on 04/19/2019 revealed no evidence of malignancy.  CA27.29 has been followed: 19.2 on 02/07/2016, 16.4 on 06/05/2016, 18.1 on 10/09/2016, 16.2 on 04/09/2017, 17.4 on 10/15/2017, 15.9 on 04/22/2018, 20.4 on 12/22/2018, and 16.5 on 09/15/2019.  Bone density on 06/27/2015 revealed osteoporosis with a T score of -3.2 in the left femoral neck, -2.5 in the hip and -2.4 in the left forearm.  She was previously on Fosamax, but discontinued for an unspecified period of time.  Bone density on 06/26/2017 revealed osteoporosis with a T-score of -3.7 in the left femoral neck.  Bone density on 09/20/2019 revealed osteoporosis with a T-score of -3.5 at the left femoral neck.  She restarted Fosamax in 06/2015 (discontinued 2018).  She is on calcium and vitamin D.  She was admitted to [facility] from 11/12/2017 - 11/28/2017.  She developed acute hypoxic hypercarbic respiratory failure requiring intubation secondary to CAP. She was admitted to [facility] MICU and transferred to [facility] on 11/14/17 following extubation for continued treatment of her CAP vs. aspiration pneumonia. Following resolution of PNA, she was found to have temporarily depressed LVEF (15-20%).  She required significant diuresis.   Repeat echo 11/26/2017 showed LVEF >70%, suggesting temporarily depressed EF due to tachyarrhythmia-induced cardiomyopathy secondary to hypervolemia and uncontrolled afib in setting of acute respiratory illness.  Symptomatically, she is doing well.  She denies any complaint.  Plan: 1.   Review labs from 09/15/2019. 2.   Stage IA Her2/neu + left breast cancer              Clinically, she is doing well.             Bilateral diagnostic mammogram on 04/19/2019 revealed no evidence of malignancy.   She has been on Arimidex since 07/2014.  Review high risk Her2/neu + breast cancer and consideration of extended adjuvant endocrine therapy.  Patient was considering discontinuing Arimidex.  Discuss extended adjuvant therapy.  Discuss [facility]CI testing.  Suspect patient will have a high risk of recurrence. 3.   Osteoporosis             Bone density on 09/20/2019 revealed osteoporosis with a T-score of -3.5 at the left femoral neck.    She restarted Fosamax in 06/2015 (discontinued 2018 secondary to diarrhea).    She is on calcium and vitamin D.  Discuss consideration of Fosamax or Prolia.  Discuss need for dental clearance. 4.   Candidal rash  Patient has moist areas under breasts and history of recurrent Candidal infections.  Continue Nystatin prn. 5.   Screening mammogram 04/19/2020. 6.   Please send copy of bone density to patient. 7.   RN to send patient booklet with phone number to contact [facility] regarding cost. 8.   RN to call patient about whether she wishes to pursue [facility]CI testing. 9.   RN to call patient regarding decision re: Prolia and dental clearance. 10.   RTC in 6 months for MD assess, labs (CBC with diff, CMP, CA27.29), and review of testing.  I discussed the assessment and treatment plan with the patient.  The patient was provided an opportunity to ask questions and all were answered.  The patient agreed with the plan and demonstrated an understanding of the instructions.  The patient was advised to call back if the symptoms worsen or if the condition fails to improve as anticipated.  I  provided 18 minutes (2:47 PM - 3:05 PM) of non-face-to-face time telephone time during this encounter.  I provided these services from the [facility] office.   Lequita Asal, MD, PhD    09/22/2019, 2:47 PM  I, Heywood Footman, am acting as a Education administrator for Lequita Asal, MD.  I, [place] Mike Gip, MD, have reviewed the above documentation for accuracy and completeness, and I agree with the above.

## 2019-09-21 ENCOUNTER — Other Ambulatory Visit: Payer: Self-pay

## 2019-09-22 ENCOUNTER — Inpatient Hospital Stay (HOSPITAL_BASED_OUTPATIENT_CLINIC_OR_DEPARTMENT_OTHER): Payer: Medicare Other | Admitting: Hematology and Oncology

## 2019-09-22 ENCOUNTER — Telehealth: Payer: Self-pay

## 2019-09-22 ENCOUNTER — Encounter: Payer: Self-pay | Admitting: Hematology and Oncology

## 2019-09-22 DIAGNOSIS — C50412 Malignant neoplasm of upper-outer quadrant of left female breast: Secondary | ICD-10-CM

## 2019-09-22 DIAGNOSIS — M810 Age-related osteoporosis without current pathological fracture: Secondary | ICD-10-CM

## 2019-09-22 DIAGNOSIS — Z170 Estrogen receptor positive status [ER+]: Secondary | ICD-10-CM | POA: Diagnosis not present

## 2019-09-22 NOTE — Telephone Encounter (Signed)
Put copy of bone density and [facility] booklet in the mail to patient today per dr Mike Gip note.

## 2019-09-22 NOTE — Patient Instructions (Signed)
Denosumab injection What is this medicine? DENOSUMAB (den oh sue mab) slows bone breakdown. Prolia is used to treat osteoporosis in women after menopause and in men, and in people who are taking corticosteroids for 6 months or more. Delton See is used to treat a high calcium level due to cancer and to prevent bone fractures and other bone problems caused by multiple myeloma or cancer bone metastases. Delton See is also used to treat giant cell tumor of the bone. This medicine may be used for other purposes; ask your health care provider or pharmacist if you have questions. COMMON BRAND NAME(S): Prolia, XGEVA What should I tell my health care provider before I take this medicine? They need to know if you have any of these conditions:  dental disease  having surgery or tooth extraction  infection  kidney disease  low levels of calcium or Vitamin D in the blood  malnutrition  on hemodialysis  skin conditions or sensitivity  thyroid or parathyroid disease  an unusual reaction to denosumab, other medicines, foods, dyes, or preservatives  pregnant or trying to get pregnant  breast-feeding How should I use this medicine? This medicine is for injection under the skin. It is given by a health care professional in a hospital or clinic setting. A special MedGuide will be given to you before each treatment. Be sure to read this information carefully each time. For Prolia, talk to your pediatrician regarding the use of this medicine in children. Special care may be needed. For Delton See, talk to your pediatrician regarding the use of this medicine in children. While this drug may be prescribed for children as young as 13 years for selected conditions, precautions do apply. Overdosage: If you think you have taken too much of this medicine contact a poison control center or emergency room at once. NOTE: This medicine is only for you. Do not share this medicine with others. What if I miss a dose? It is  important not to miss your dose. Call your doctor or health care professional if you are unable to keep an appointment. What may interact with this medicine? Do not take this medicine with any of the following medications:  other medicines containing denosumab This medicine may also interact with the following medications:  medicines that lower your chance of fighting infection  steroid medicines like prednisone or cortisone This list may not describe all possible interactions. Give your health care provider a list of all the medicines, herbs, non-prescription drugs, or dietary supplements you use. Also tell them if you smoke, drink alcohol, or use illegal drugs. Some items may interact with your medicine. What should I watch for while using this medicine? Visit your doctor or health care professional for regular checks on your progress. Your doctor or health care professional may order blood tests and other tests to see how you are doing. Call your doctor or health care professional for advice if you get a fever, chills or sore throat, or other symptoms of a cold or flu. Do not treat yourself. This drug may decrease your body's ability to fight infection. Try to avoid being around people who are sick. You should make sure you get enough calcium and vitamin D while you are taking this medicine, unless your doctor tells you not to. Discuss the foods you eat and the vitamins you take with your health care professional. See your dentist regularly. Brush and floss your teeth as directed. Before you have any dental work done, tell your dentist  you are receiving this medicine. Do not become pregnant while taking this medicine or for 5 months after stopping it. Talk with your doctor or health care professional about your birth control options while taking this medicine. Women should inform their doctor if they wish to become pregnant or think they might be pregnant. There is a potential for serious side  effects to an unborn child. Talk to your health care professional or pharmacist for more information. What side effects may I notice from receiving this medicine? Side effects that you should report to your doctor or health care professional as soon as possible:  allergic reactions like skin rash, itching or hives, swelling of the face, lips, or tongue  bone pain  breathing problems  dizziness  jaw pain, especially after dental work  redness, blistering, peeling of the skin  signs and symptoms of infection like fever or chills; cough; sore throat; pain or trouble passing urine  signs of low calcium like fast heartbeat, muscle cramps or muscle pain; pain, tingling, numbness in the hands or feet; seizures  unusual bleeding or bruising  unusually weak or tired Side effects that usually do not require medical attention (report to your doctor or health care professional if they continue or are bothersome):  constipation  diarrhea  headache  joint pain  loss of appetite  muscle pain  runny nose  tiredness  upset stomach This list may not describe all possible side effects. Call your doctor for medical advice about side effects. You may report side effects to FDA at 1-800-FDA-1088. Where should I keep my medicine? This medicine is only given in a clinic, doctor's office, or other health care setting and will not be stored at home. NOTE: This sheet is a summary. It may not cover all possible information. If you have questions about this medicine, talk to your doctor, pharmacist, or health care provider.  2020 Elsevier/Gold Standard (2017-10-17 16:10:44)   Alendronate tablets What is this medicine? ALENDRONATE (a LEN droe nate) slows calcium loss from bones. It helps to make normal healthy bone and to slow bone loss in people with Paget's disease and osteoporosis. It may be used in others at risk for bone loss. This medicine may be used for other purposes; ask your health  care provider or pharmacist if you have questions. COMMON BRAND NAME(S): Fosamax What should I tell my health care provider before I take this medicine? They need to know if you have any of these conditions:  dental disease  esophagus, stomach, or intestine problems, like acid reflux or GERD  kidney disease  low blood calcium  low vitamin D  problems sitting or standing 30 minutes  trouble swallowing  an unusual or allergic reaction to alendronate, other medicines, foods, dyes, or preservatives  pregnant or trying to get pregnant  breast-feeding How should I use this medicine? You must take this medicine exactly as directed or you will lower the amount of the medicine you absorb into your body or you may cause yourself harm. Take this medicine by mouth first thing in the morning, after you are up for the day. Do not eat or drink anything before you take your medicine. Swallow the tablet with a full glass (6 to 8 fluid ounces) of plain water. Do not take this medicine with any other drink. Do not chew or crush the tablet. After taking this medicine, do not eat breakfast, drink, or take any medicines or vitamins for at least 30 minutes. Sit or stand  up for at least 30 minutes after you take this medicine; do not lie down. Do not take your medicine more often than directed. Talk to your pediatrician regarding the use of this medicine in children. Special care may be needed. Overdosage: If you think you have taken too much of this medicine contact a poison control center or emergency room at once. NOTE: This medicine is only for you. Do not share this medicine with others. What if I miss a dose? If you miss a dose, do not take it later in the day. Continue your normal schedule starting the next morning. Do not take double or extra doses. What may interact with this medicine?  aluminum hydroxide  antacids  aspirin  calcium supplements  drugs for inflammation like ibuprofen,  naproxen, and others  iron supplements  magnesium supplements  vitamins with minerals This list may not describe all possible interactions. Give your health care provider a list of all the medicines, herbs, non-prescription drugs, or dietary supplements you use. Also tell them if you smoke, drink alcohol, or use illegal drugs. Some items may interact with your medicine. What should I watch for while using this medicine? Visit your doctor or health care professional for regular checks ups. It may be some time before you see benefit from this medicine. Do not stop taking your medicine except on your doctor's advice. Your doctor or health care professional may order blood tests and other tests to see how you are doing. You should make sure you get enough calcium and vitamin D while you are taking this medicine, unless your doctor tells you not to. Discuss the foods you eat and the vitamins you take with your health care professional. Some people who take this medicine have severe bone, joint, and/or muscle pain. This medicine may also increase your risk for a broken thigh bone. Tell your doctor right away if you have pain in your upper leg or groin. Tell your doctor if you have any pain that does not go away or that gets worse. This medicine can make you more sensitive to the sun. If you get a rash while taking this medicine, sunlight may cause the rash to get worse. Keep out of the sun. If you cannot avoid being in the sun, wear protective clothing and use sunscreen. Do not use sun lamps or tanning beds/booths. What side effects may I notice from receiving this medicine? Side effects that you should report to your doctor or health care professional as soon as possible:  allergic reactions like skin rash, itching or hives, swelling of the face, lips, or tongue  black or tarry stools  bone, muscle or joint pain  changes in vision  chest pain  heartburn or stomach pain  jaw pain, especially  after dental work  pain or trouble when swallowing  redness, blistering, peeling or loosening of the skin, including inside the mouth Side effects that usually do not require medical attention (report to your doctor or health care professional if they continue or are bothersome):  changes in taste  diarrhea or constipation  eye pain or itching  headache  nausea or vomiting  stomach gas or fullness This list may not describe all possible side effects. Call your doctor for medical advice about side effects. You may report side effects to FDA at 1-800-FDA-1088. Where should I keep my medicine? Keep out of the reach of children. Store at room temperature of 15 and 30 degrees C (59 and 86 degrees F).  Throw away any unused medicine after the expiration date. NOTE: This sheet is a summary. It may not cover all possible information. If you have questions about this medicine, talk to your doctor, pharmacist, or health care provider.  2020 Elsevier/Gold Standard (2010-12-07 08:56:09)

## 2019-09-30 ENCOUNTER — Telehealth: Payer: Self-pay

## 2019-10-01 ENCOUNTER — Telehealth: Payer: Self-pay

## 2019-10-01 NOTE — Telephone Encounter (Signed)
Error

## 2019-10-01 NOTE — Telephone Encounter (Signed)
Spoke with the patient to see if she like to move forward with the BCI testing and Prolia injection with the dental clearance. The patient states at this time she wish not to go forward with this treatment. The patient was understanding and agreeable.

## 2019-10-19 NOTE — Progress Notes (Unsigned)
This encounter was created in error - please disregard.

## 2019-12-07 ENCOUNTER — Telehealth: Payer: Self-pay

## 2019-12-07 NOTE — Telephone Encounter (Signed)
Contacted patient per Dr Drenda Freeze message: "Please call patient. At last visit, she was deciding about BCI testing and whether she wanted to continue endocrine therapy (Arimidex). I haven't heard anything further. There is a Rx refill for Arimidex in my box today."  Patient states that she does want to continue the Arimidex and she does not want BCI testing

## 2020-02-13 IMAGING — MG DIGITAL DIAGNOSTIC BILAT W/ TOMO W/ CAD
6 of 11 series · 6 of 31 positions shown · non-contrast
Comparison: Multiple prior studies including 10/01/2017

CLINICAL DATA: History of RIGHT lumpectomy with radiation treatment
4889. History of LEFT lumpectomy with radiation treatment
8407.Stereotactic guided core biopsy of LEFT breast calcifications
performed in September 2017 showed benign changes consistent previous
radiation.

EXAM:
DIGITAL DIAGNOSTIC BILATERAL MAMMOGRAM WITH CAD AND TOMO

[L CC]
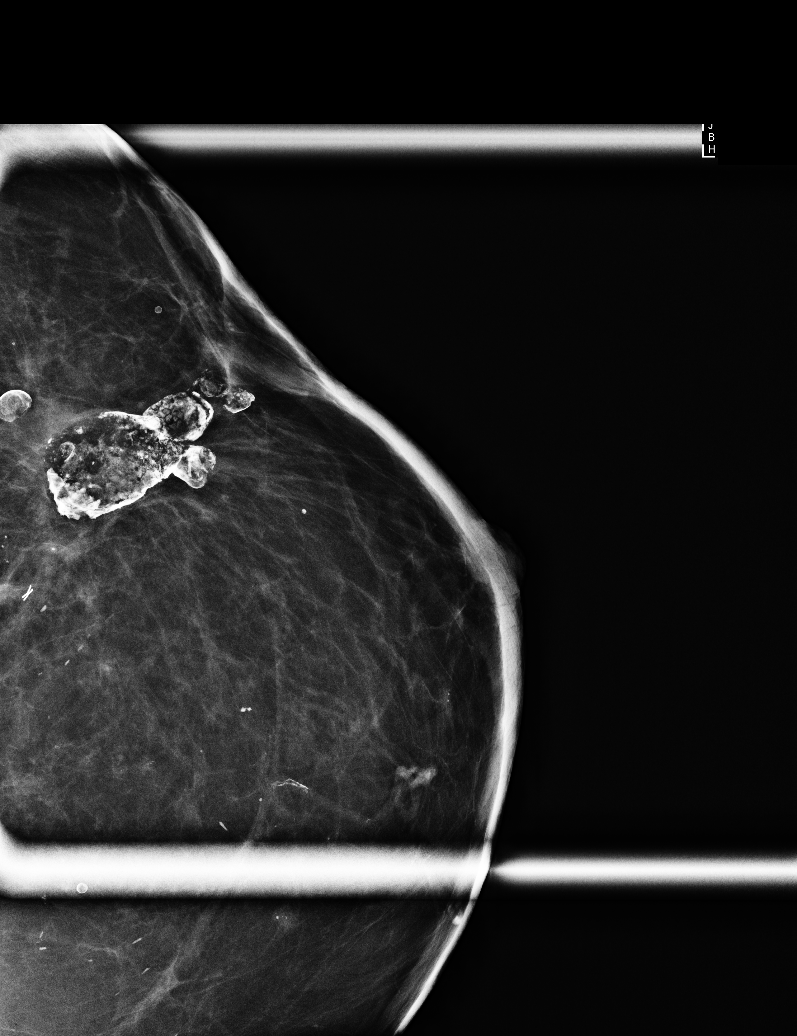

[L MLO synth-2D (1 of 2)]
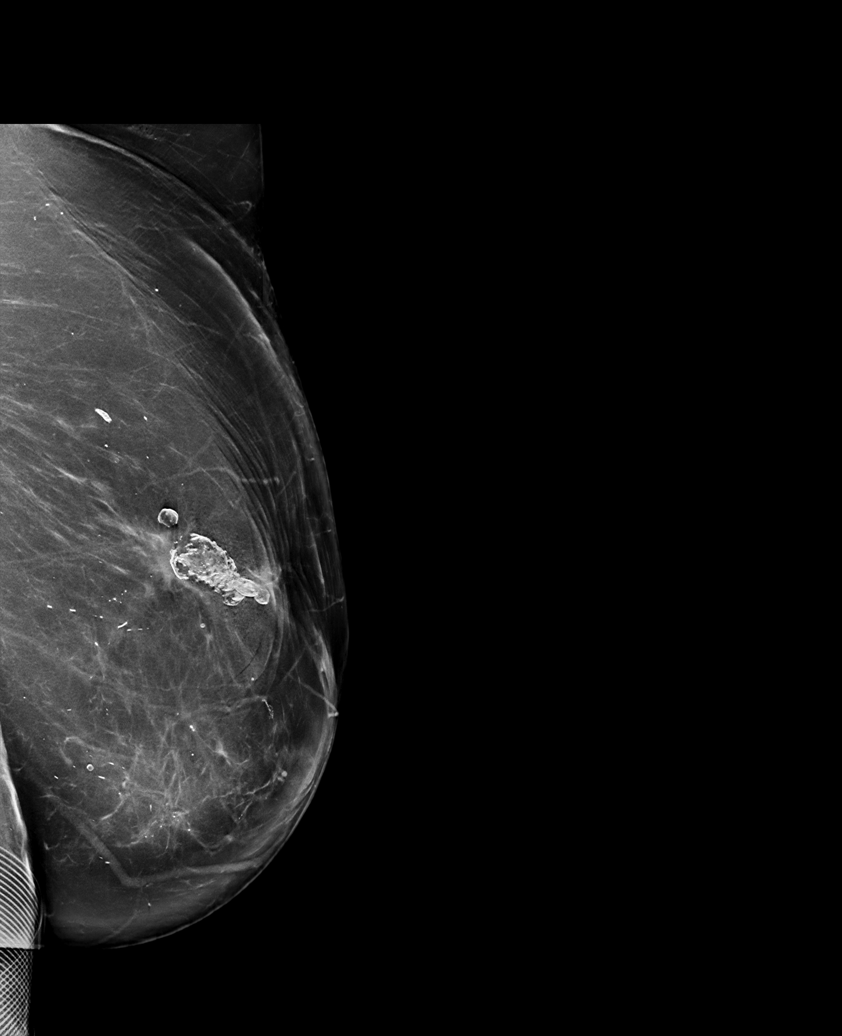

[L CC synth-2D]
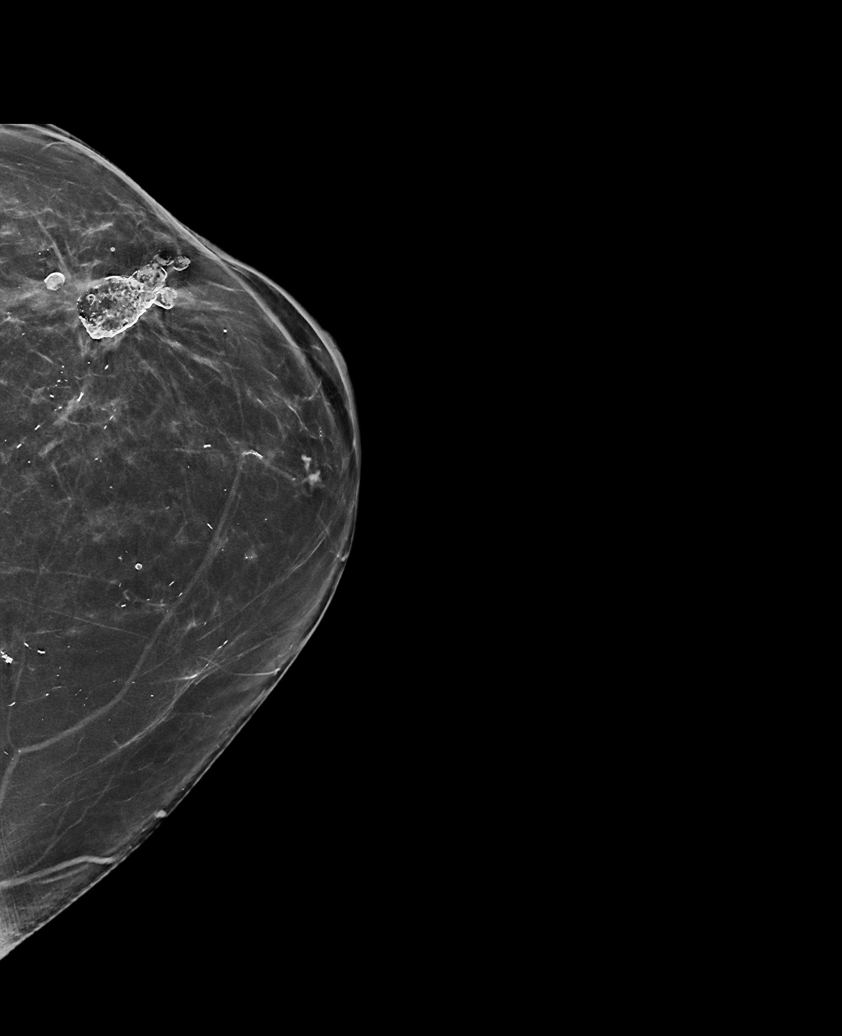

[R MLO synth-2D]
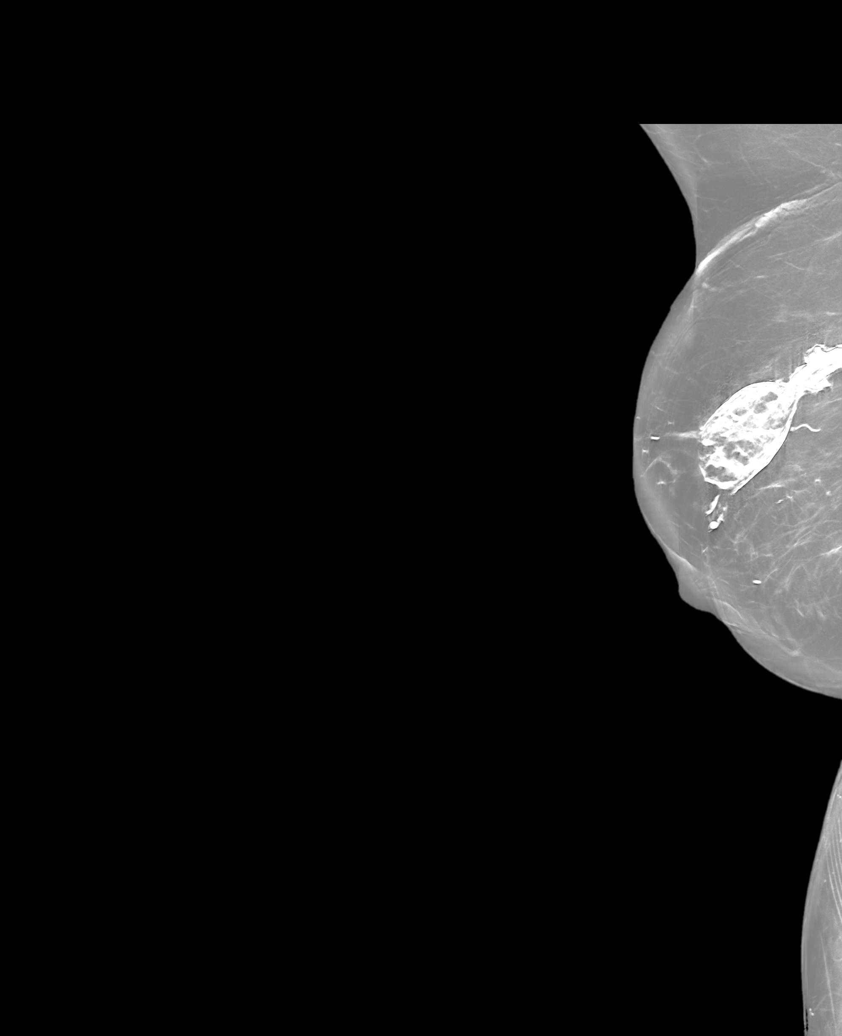

[R CC synth-2D]
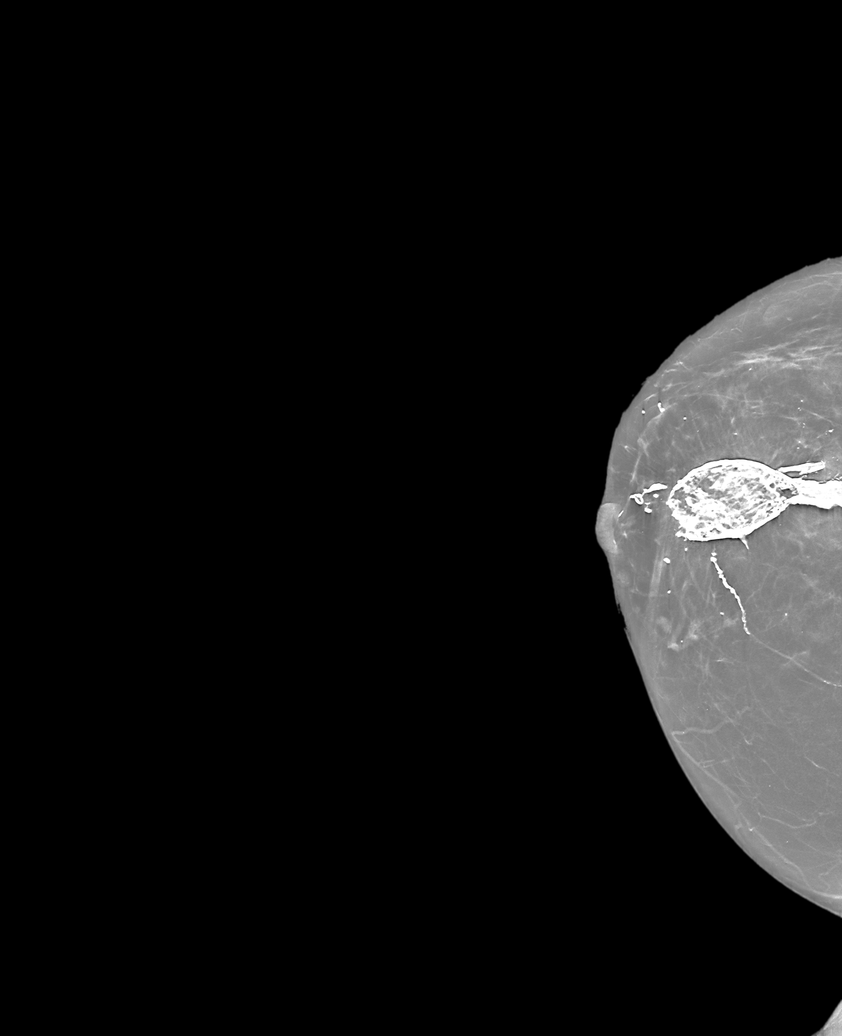

[L MLO synth-2D (2 of 2)]
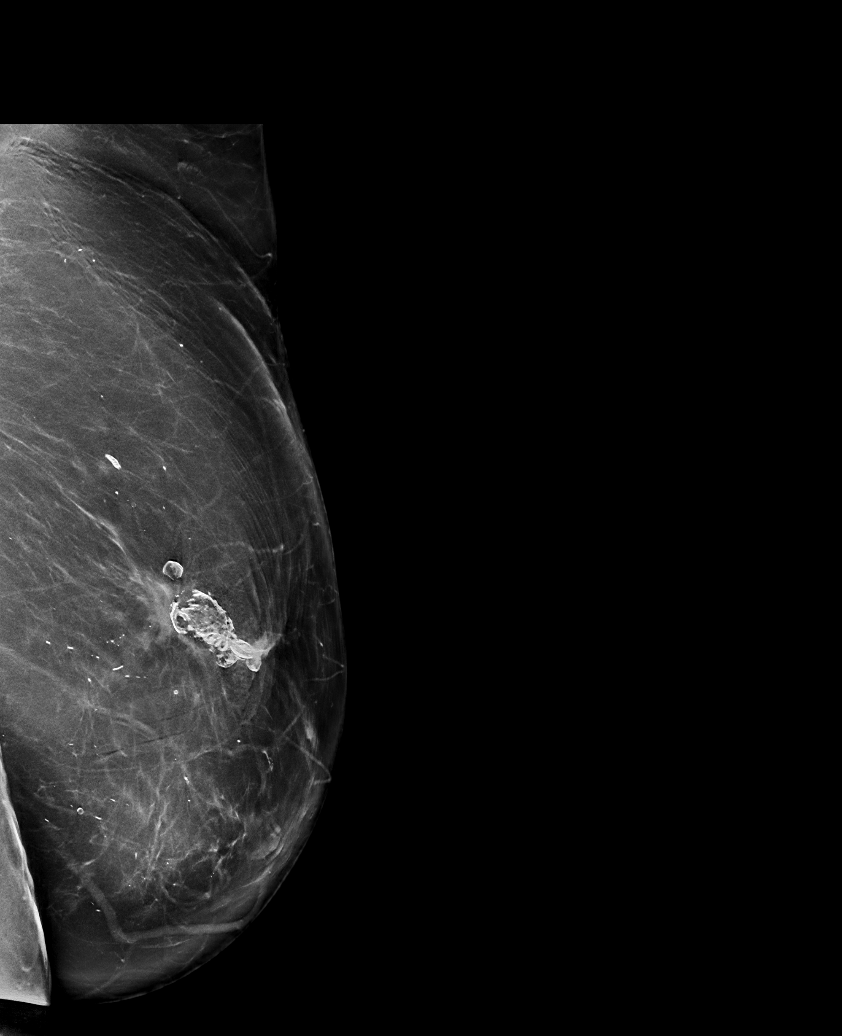

[6 of 31 positions shown; findings below may reference images not displayed]

ACR Breast Density Category b: There are scattered areas of
fibroglandular density.
FINDINGS: The best images are obtained, given the patient's limited mobility.
She is unable to stand today and now uses a wheelchair.

Postoperative changes are identified bilaterally. Coarse, benign
calcifications are seen in each breast. No suspicious mass,
distortion, or microcalcifications are identified to suggest
presence of malignancy. Mammographic images were processed with CAD.
IMPRESSION: No mammographic evidence for malignancy.

RECOMMENDATION:
Screening mammogram in one year.(Code:[id number])

I have discussed the findings and recommendations with the patient.
If applicable, a reminder letter will be sent to the patient
regarding the next appointment.

BI-RADS CATEGORY  2: Benign.

## 2020-03-23 ENCOUNTER — Other Ambulatory Visit: Payer: Medicare Other | Admitting: Hematology and Oncology

## 2020-03-23 ENCOUNTER — Other Ambulatory Visit: Payer: Medicare Other

## 2020-03-28 ENCOUNTER — Other Ambulatory Visit: Payer: Medicare Other

## 2020-03-28 ENCOUNTER — Inpatient Hospital Stay: Payer: Medicare Other | Attending: Hematology and Oncology | Admitting: Hematology and Oncology

## 2020-04-10 NOTE — Progress Notes (Deleted)
[facility]  [address], [address] [place], [place] [address] Phone: [phone]  Fax: [phone]  Clinic Day:  04/10/2020  Referring physician: Sofie Hartigan, MD  Chief Complaint: Danielle Bennett is a 85 y.o. female with a history of multi-focal right breast DCIS (1995) and stage IA Her2/neu + left breast cancer (2015) who is seen for 6 month assessment.  HPI: The patient was last seen in the medical oncology clinic on 09/22/2019 via telemedicine. At that time, she is doing well.  She denies any complaints. Discussed BCI testing and the need for dental clearance for Prolia. She continued calcium and Vitamin D. She was still taking Arimidex but was considering discontinuing it.  The patient saw Dr. Holley Raring on 12/08/2019. Her chronic kidney disease was felt to be stable with a recent GFR of 26 ml/min. Her lower extremity edema was managed with Lasix 40 mg BID. She reported shortness of breath with minimal exertion. Follow up was planned for 3 months.  During the interim, ***   Past Medical History:  Diagnosis Date  . Breast cancer ([place]) 1995   right breast cancer  . Breast cancer ([place]) 2015   left breast cancer  . Diabetes mellitus without complication ([place])   . GERD (gastroesophageal reflux disease)   . Personal history of radiation therapy 1995  . Personal history of radiation therapy 2015  . Thyroid disease     Past Surgical History:  Procedure Laterality Date  . BREAST BIOPSY Left 10/01/2017   affirm stereo/ neg  . BREAST LUMPECTOMY Right 1995  . BREAST LUMPECTOMY Left 04/2014    Family History  Problem Relation Age of Onset  . Heart attack Mother   . Kidney cancer Brother   . Prostate cancer Brother   . Esophageal cancer Brother   . Breast cancer Neg Hx     Social History:  reports that she has never smoked. She has never used smokeless tobacco. She reports that she does not drink alcohol and does not use drugs.  She lives  [place]. She is a retired Network engineer from [employer] She lives alone.  She has family members who live on either side of her. The patient is alone*** today.  Allergies:  Allergies  Allergen Reactions  . Simvastatin     Other reaction(s): Other (See Comments) Myalgias    Current Medications: Current Outpatient Medications  Medication Sig Dispense Refill  . anastrozole (ARIMIDEX) 1 MG tablet Take 1 tablet (1 mg total) by mouth daily. 90 tablet 0  . apixaban (ELIQUIS) 5 MG TABS tablet Take 2.5 mg by mouth daily.     Marland Kitchen atorvastatin (LIPITOR) 40 MG tablet TAKE ONE TABLET BY MOUTH ONCE DAILY AT NIGHT TIME - MWF. and also takes a half tablet all other days.    . betamethasone valerate (VALISONE) 0.1 % cream Apply to skin as directed prn for itch    . Calcium Carb-Cholecalciferol (CALTRATE 600+D) 600-800 MG-UNIT TABS Take 1 tablet by mouth 2 (two) times daily.     . famotidine (PEPCID) 10 MG tablet Take 10 mg by mouth at bedtime.     . furosemide (LASIX) 20 MG tablet Take 40 mg by mouth 2 (two) times daily.     Marland Kitchen glipiZIDE (GLUCOTROL XL) 10 MG 24 hr tablet Take 10 mg by mouth daily.     Marland Kitchen glucose blood test strip USE 3 (THREE) TIMES DAILY.    Marland Kitchen insulin lispro (HUMALOG) 100 UNIT/ML KwikPen  10 before breakfast, 12 units before lunch and dinner, plus sliding scale, max 60 units daily    . Insulin Pen Needle (B-D ULTRAFINE III SHORT PEN) 31G X 8 MM MISC USE 1  THREE TIMES DAILY AS DIRECTED    . levothyroxine (SYNTHROID, LEVOTHROID) 137 MCG tablet Take 137 mcg by mouth daily.     . Liraglutide (VICTOZA) 18 MG/3ML SOPN Inject into the skin daily.     Marland Kitchen lisinopril (PRINIVIL,ZESTRIL) 20 MG tablet Take 10 mg by mouth daily.   3  . metoprolol succinate (TOPROL-XL) 100 MG 24 hr tablet Take 100 mg by mouth. Take 1.5 tablets once a day.    . metroNIDAZOLE (METROCREAM) 0.75 % cream Apply 1 application topically daily.     . Microlet Lancets MISC USE TO CHECK GLUCOSE THREE TIMES DAILY  AS DIRECTED    . nystatin (MYCOSTATIN/NYSTOP) powder Apply topically 3 (three) times daily. 30 g 5  . PREVIDENT 5000 DRY MOUTH 1.1 % GEL dental gel BRUSH WITH PASTE 2 TO 3 TIMES PER DAY ** DO NOT RINSE **  5  . TRESIBA FLEXTOUCH 100 UNIT/ML SOPN FlexTouch Pen Inject 35 Units into the skin daily.      No current facility-administered medications for this visit.    Review of Systems  Constitutional: Negative for chills, diaphoresis, fever, malaise/fatigue and weight loss.       Feels "good".  HENT: Negative.  Negative for congestion, ear pain, nosebleeds, sinus pain and sore throat.   Eyes: Negative.  Negative for blurred vision and double vision.  Respiratory: Positive for shortness of breath (with walking). Negative for cough and sputum production.   Cardiovascular: Positive for orthopnea (sleep in a chair at times). Negative for chest pain, palpitations and leg swelling.  Gastrointestinal: Negative for abdominal pain, blood in stool, constipation, diarrhea, heartburn, melena, nausea and vomiting.  Genitourinary: Negative.  Negative for dysuria, frequency, hematuria and urgency.  Musculoskeletal: Negative.  Negative for back pain, joint pain and myalgias.  Skin: Positive for rash (under breasts). Negative for itching.  Neurological: Negative.  Negative for dizziness, tingling, sensory change, speech change, weakness and headaches.       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  Endo/Heme/Allergies: Does not bruise/bleed easily.       Diabetes. Thyroid disease on Synthroid.   Psychiatric/Behavioral: Negative.  Negative for depression and memory loss. The patient is not nervous/anxious and does not have insomnia.   All other systems reviewed and are negative.  Performance status (ECOG):  1***  Wt Readings from Last 3 Encounters:  09/15/19 276 lb (125.2 kg)  12/22/18 274 lb 4.8 oz (124.4 kg)  08/10/18 250 lb (113.4 kg)    Vital Signs There were no vitals taken for this visit.   Physical Exam  No visits  with results within 3 Day(s) from this visit.  Latest known visit with results is:  Appointment on 09/15/2019  Component Date Value Ref Range Status  . CA 27.29 09/15/2019 16.5  0.0 - 38.6 U/mL Final   Comment: (NOTE) Siemens Centaur Immunochemiluminometric Methodology [facility]) Values obtained with different assay methods or kits cannot be used interchangeably. Results cannot be interpreted as absolute evidence of the presence or absence of malignant disease. Performed At: [facility] [place], [place] [id number] Rush Farmer MD MV:[id number]   . Sodium 09/15/2019 137  135 - 145 mmol/L Final  . Potassium 09/15/2019 5.0  3.5 - 5.1 mmol/L Final  . Chloride 09/15/2019 104  98 - 111 mmol/L Final  .  CO2 09/15/2019 21* 22 - 32 mmol/L Final  . Glucose, Bld 09/15/2019 224* 70 - 99 mg/dL Final   Glucose reference range applies only to samples taken after fasting for at least 8 hours.  . BUN 09/15/2019 41* 8 - 23 mg/dL Final  . Creatinine, Ser 09/15/2019 1.86* 0.44 - 1.00 mg/dL Final  . Calcium 09/15/2019 9.0  8.9 - 10.3 mg/dL Final  . Total Protein 09/15/2019 6.8  6.5 - 8.1 g/dL Final  . Albumin 09/15/2019 3.3* 3.5 - 5.0 g/dL Final  . AST 09/15/2019 20  15 - 41 U/L Final  . ALT 09/15/2019 19  0 - 44 U/L Final  . Alkaline Phosphatase 09/15/2019 61  38 - 126 U/L Final  . Total Bilirubin 09/15/2019 0.8  0.3 - 1.2 mg/dL Final  . GFR calc non Af Amer 09/15/2019 25* >60 mL/min Final  . GFR calc Af Amer 09/15/2019 29* >60 mL/min Final  . Anion gap 09/15/2019 12  5 - 15 Final   Performed at [facility], [address], [place], [place] [address]    Assessment:  Danielle Bennett is a 85 y.o. female with multi-focal right breast DCIS (1995) and stage IA Her2/neu + left breast cancer (2015).  She was diagnosed with multifocal DCIS of the right breast in 1995.  She underwent biopsy on 02/01/1994.  Pathology revealed multi-focal DCIS with comedo necrosis.   There was no evidence of lymphatic/vascular invasion.  Microcalcifications were present.  Margins were close (less than 1 mm).  She underwent re-excision on 03/01/1994 by Dr. Siri Cole at [facility].  There was no gross evidence of malignancy.  She received radiation.  She did not receive hormonal therapy.  She was diagnosed with Her2/neu + left breast cancer in 2015.  Stereotactic core biopsy on 04/05/2014 revealed invasive mammary carcinoma of no special type (1.1 mm) in a background of DCIS.  Wide excision of the mass in the left upper outer quadrant on 04/26/2014 revealed grade II DCIS, cribriform type with focal lobular cytology.  There was no residual invasive carcinoma.  No tumor was seen in 2 lymph nodes.  Tumor was ER positive (> 90%), PR positive (90%), and Her2/neu 3+.  Pathologic stage was T1aN0(sn) M0.  She received adjuvant left whole breast radiation in 06/2014.  She started Arimidex in 07/2014.  Bilateral mammogram on 08/22/2016 revealed no evidence of malignancy.  Bilateral diagnostic mammogram on 09/18/2017 revealed a 1.2 cm group of indeterminate calcifications in the left breast adjacent to the lumpectomy site.  Left breast biopsy on 10/01/2017 revealed cytologic changes c/w prior radiation therapy.  There was scar-like fibrosis and foreign body giant cell reaction.  Microcalcifications were present.  There was no atypia or malignancy.  Bilateral diagnostic mammogram on 04/19/2019 revealed no evidence of malignancy.  CA27.29 has been followed: 19.2 on 02/07/2016, 16.4 on 06/05/2016, 18.1 on 10/09/2016, 16.2 on 04/09/2017, 17.4 on 10/15/2017, 15.9 on 04/22/2018, 20.4 on 12/22/2018, and 16.5 on 09/15/2019.  Bone density on 06/27/2015 revealed osteoporosis with a T score of -3.2 in the left femoral neck, -2.5 in the hip and -2.4 in the left forearm.  She was previously on Fosamax, but discontinued for an unspecified period of time.  Bone density on 06/26/2017 revealed osteoporosis with  a T-score of -3.7 in the left femoral neck.  Bone density on 09/20/2019 revealed osteoporosis with a T-score of -3.5 at the left femoral neck.  She restarted Fosamax in 06/2015 (discontinued 2018).  She is on calcium and  vitamin D.  She was admitted to [facility] from 11/12/2017 - 11/28/2017.  She developed acute hypoxic hypercarbic respiratory failure requiring intubation secondary to CAP. She was admitted to [facility] and transferred to [facility] on 11/14/17 following extubation for continued treatment of her CAP vs. aspiration pneumonia. Following resolution of PNA, she was found to have temporarily depressed LVEF (15-20%).  She required significant diuresis.   Repeat echo 11/26/2017 showed LVEF >70%, suggesting temporarily depressed EF due to tachyarrhythmia-induced cardiomyopathy secondary to hypervolemia and uncontrolled afib in setting of acute respiratory illness.  Symptomatically, ***  Plan: 1.   Labs today: CBC with diff, CMP, CA27.29   2.   Stage IA Her2/neu + left breast cancer             Clinically, she is doing well.             Bilateral diagnostic mammogram on 04/19/2019 revealed no evidence of malignancy.   She has been on Arimidex since 07/2014.  Review high risk Her2/neu + breast cancer and consideration of extended adjuvant endocrine therapy.  Patient was considering discontinuing Arimidex.  Discuss extended adjuvant therapy.  Discuss BCI testing.  Suspect patient will have a high risk of recurrence. 3.   Osteoporosis             Bone density on 09/20/2019 revealed osteoporosis with a T-score of -3.5 at the left femoral neck.    She restarted Fosamax in 06/2015 (discontinued 2018 secondary to diarrhea).    She is on calcium and vitamin D.  Discuss consideration of Fosamax or Prolia.  Discuss need for dental clearance. 4.   Candidal rash  Patient has moist areas under breasts and history of recurrent Candidal infections.  Continue Nystatin prn. 5.   Screening mammogram  04/19/2020. 6.   Please send copy of bone density to patient. 7.   RN to send patient booklet with phone number to contact [facility] regarding cost. 8.   RN to call patient about whether she wishes to pursue BCI testing. 9.   RN to call patient regarding decision re: Prolia and dental clearance. 10.   RTC in 6 months for MD assess, labs (CBC with diff, CMP, CA27.29), and review of testing.  I discussed the assessment and treatment plan with the patient.  The patient was provided an opportunity to ask questions and all were answered.  The patient agreed with the plan and demonstrated an understanding of the instructions.  The patient was advised to call back if the symptoms worsen or if the condition fails to improve as anticipated.  I provided *** minutes of face-to-face time during this this encounter and > 50% was spent counseling as documented under my assessment and plan.  Lequita Asal, MD, PhD    04/10/2020, 1:12 PM  I, Lequita Asal, am acting as a Education administrator for Lequita Asal, MD.  I, Reagan Mike Gip, MD, have reviewed the above documentation for accuracy and completeness, and I agree with the above.

## 2020-04-10 NOTE — Progress Notes (Signed)
This encounter was created in error - please disregard.

## 2020-04-11 ENCOUNTER — Other Ambulatory Visit: Payer: Medicare Other

## 2020-04-11 ENCOUNTER — Ambulatory Visit: Payer: Medicare Other | Admitting: Hematology and Oncology

## 2020-04-17 ENCOUNTER — Ambulatory Visit: Payer: Medicare Other | Admitting: Hematology and Oncology

## 2020-04-17 ENCOUNTER — Other Ambulatory Visit: Payer: Medicare Other

## 2020-04-27 ENCOUNTER — Telehealth: Payer: Self-pay

## 2020-04-27 ENCOUNTER — Other Ambulatory Visit: Payer: Self-pay | Admitting: Hematology and Oncology

## 2020-04-27 DIAGNOSIS — C50412 Malignant neoplasm of upper-outer quadrant of left female breast: Secondary | ICD-10-CM

## 2020-04-27 DIAGNOSIS — Z170 Estrogen receptor positive status [ER+]: Secondary | ICD-10-CM

## 2020-04-27 NOTE — Telephone Encounter (Signed)
Left a message to see if the patient needed a refill on the Arimidex. No answer I was able to leave a message

## 2020-04-28 ENCOUNTER — Telehealth: Payer: Self-pay

## 2020-04-28 NOTE — Telephone Encounter (Signed)
I was able to leave a message for the patient to see if she need a refill on the Arimidex.

## 2020-05-17 ENCOUNTER — Ambulatory Visit: Payer: Medicare Other | Admitting: Hematology and Oncology

## 2020-05-17 ENCOUNTER — Other Ambulatory Visit: Payer: Medicare Other

## 2020-05-24 ENCOUNTER — Other Ambulatory Visit: Payer: Self-pay | Admitting: Hematology and Oncology

## 2020-05-24 DIAGNOSIS — Z170 Estrogen receptor positive status [ER+]: Secondary | ICD-10-CM

## 2020-05-24 DIAGNOSIS — C50412 Malignant neoplasm of upper-outer quadrant of left female breast: Secondary | ICD-10-CM

## 2020-06-14 ENCOUNTER — Other Ambulatory Visit: Payer: Self-pay | Admitting: Hematology and Oncology

## 2020-06-14 DIAGNOSIS — C50412 Malignant neoplasm of upper-outer quadrant of left female breast: Secondary | ICD-10-CM

## 2020-06-30 ENCOUNTER — Other Ambulatory Visit: Payer: Self-pay | Admitting: Hematology and Oncology

## 2020-06-30 DIAGNOSIS — Z170 Estrogen receptor positive status [ER+]: Secondary | ICD-10-CM

## 2020-06-30 DIAGNOSIS — C50412 Malignant neoplasm of upper-outer quadrant of left female breast: Secondary | ICD-10-CM

## 2020-06-30 NOTE — Telephone Encounter (Signed)
.     Stage IA Her2/neu + left breast cancer Clinically, she is doing well. Bilateral diagnostic mammogram on 04/19/2019 revealed no evidence of malignancy.              She has been on Arimidex since 07/2014.             Review high risk Her2/neu + breast cancer and consideration of extended adjuvant endocrine therapy.             Patient was considering discontinuing Arimidex.             Discuss extended adjuvant therapy.  Discuss BCI testing.  Suspect patient will have a high risk of recurrence.

## 2020-10-22 DEATH — deceased
